# Patient Record
Sex: Female | Born: 1940 | Race: White | Hispanic: No | Marital: Married | Smoking: Former smoker
Health system: Southern US, Community
[De-identification: ages and names within clinical notes are randomized; demographics above are authoritative.]

## PROBLEM LIST (undated history)

## (undated) DIAGNOSIS — K219 Gastro-esophageal reflux disease without esophagitis: Secondary | ICD-10-CM

## (undated) DIAGNOSIS — F329 Major depressive disorder, single episode, unspecified: Secondary | ICD-10-CM

## (undated) DIAGNOSIS — I10 Essential (primary) hypertension: Secondary | ICD-10-CM

## (undated) DIAGNOSIS — T4145XA Adverse effect of unspecified anesthetic, initial encounter: Secondary | ICD-10-CM

## (undated) DIAGNOSIS — J449 Chronic obstructive pulmonary disease, unspecified: Secondary | ICD-10-CM

## (undated) DIAGNOSIS — T8859XA Other complications of anesthesia, initial encounter: Secondary | ICD-10-CM

## (undated) DIAGNOSIS — I2510 Atherosclerotic heart disease of native coronary artery without angina pectoris: Secondary | ICD-10-CM

## (undated) DIAGNOSIS — N189 Chronic kidney disease, unspecified: Secondary | ICD-10-CM

## (undated) DIAGNOSIS — M1990 Unspecified osteoarthritis, unspecified site: Secondary | ICD-10-CM

## (undated) DIAGNOSIS — F32A Depression, unspecified: Secondary | ICD-10-CM

## (undated) HISTORY — PX: ABDOMINAL HYSTERECTOMY: SHX81

---

## 2004-08-15 HISTORY — PX: CARDIAC CATHETERIZATION: SHX172

## 2005-02-09 ENCOUNTER — Other Ambulatory Visit: Payer: Self-pay

## 2005-02-09 ENCOUNTER — Inpatient Hospital Stay: Payer: Self-pay | Admitting: Internal Medicine

## 2005-06-27 ENCOUNTER — Ambulatory Visit: Payer: Self-pay | Admitting: Internal Medicine

## 2005-09-05 ENCOUNTER — Ambulatory Visit: Payer: Self-pay | Admitting: Pain Medicine

## 2005-09-27 ENCOUNTER — Ambulatory Visit: Payer: Self-pay | Admitting: Internal Medicine

## 2005-09-28 ENCOUNTER — Ambulatory Visit: Payer: Self-pay | Admitting: Pain Medicine

## 2006-10-14 ENCOUNTER — Inpatient Hospital Stay: Payer: Self-pay | Admitting: Internal Medicine

## 2006-10-14 ENCOUNTER — Other Ambulatory Visit: Payer: Self-pay

## 2006-11-16 ENCOUNTER — Ambulatory Visit: Payer: Self-pay | Admitting: Internal Medicine

## 2007-01-01 ENCOUNTER — Ambulatory Visit: Payer: Self-pay | Admitting: Unknown Physician Specialty

## 2007-01-02 ENCOUNTER — Ambulatory Visit: Payer: Self-pay | Admitting: Unknown Physician Specialty

## 2007-04-19 ENCOUNTER — Ambulatory Visit: Payer: Self-pay | Admitting: Unknown Physician Specialty

## 2007-09-24 ENCOUNTER — Ambulatory Visit: Payer: Self-pay | Admitting: Internal Medicine

## 2008-01-10 ENCOUNTER — Ambulatory Visit: Payer: Self-pay | Admitting: Internal Medicine

## 2008-08-15 HISTORY — PX: CATARACT EXTRACTION W/ INTRAOCULAR LENS IMPLANT: SHX1309

## 2008-09-09 ENCOUNTER — Ambulatory Visit: Payer: Self-pay | Admitting: Unknown Physician Specialty

## 2008-10-18 ENCOUNTER — Inpatient Hospital Stay: Payer: Self-pay | Admitting: Orthopedic Surgery

## 2008-12-03 ENCOUNTER — Ambulatory Visit: Payer: Self-pay | Admitting: Internal Medicine

## 2009-04-02 ENCOUNTER — Ambulatory Visit: Payer: Self-pay | Admitting: Orthopedic Surgery

## 2009-04-09 ENCOUNTER — Inpatient Hospital Stay: Payer: Self-pay | Admitting: Orthopedic Surgery

## 2009-04-13 ENCOUNTER — Encounter: Payer: Self-pay | Admitting: Internal Medicine

## 2009-04-15 ENCOUNTER — Encounter: Payer: Self-pay | Admitting: Internal Medicine

## 2009-07-07 ENCOUNTER — Ambulatory Visit: Payer: Self-pay | Admitting: Ophthalmology

## 2009-07-20 ENCOUNTER — Ambulatory Visit: Payer: Self-pay | Admitting: Ophthalmology

## 2009-08-15 HISTORY — PX: CATARACT EXTRACTION W/ INTRAOCULAR LENS IMPLANT: SHX1309

## 2009-11-16 ENCOUNTER — Ambulatory Visit: Payer: Self-pay | Admitting: Ophthalmology

## 2009-12-30 ENCOUNTER — Ambulatory Visit: Payer: Self-pay | Admitting: Internal Medicine

## 2010-05-03 ENCOUNTER — Ambulatory Visit: Payer: Self-pay | Admitting: Otolaryngology

## 2010-06-07 ENCOUNTER — Ambulatory Visit: Payer: Self-pay | Admitting: Orthopedic Surgery

## 2010-07-12 ENCOUNTER — Ambulatory Visit: Payer: Self-pay | Admitting: Anesthesiology

## 2010-08-15 HISTORY — PX: JOINT REPLACEMENT: SHX530

## 2010-09-02 ENCOUNTER — Ambulatory Visit: Payer: Self-pay | Admitting: Anesthesiology

## 2011-07-27 ENCOUNTER — Ambulatory Visit: Payer: Self-pay | Admitting: Internal Medicine

## 2011-08-16 HISTORY — PX: CAROTID ENDARTERECTOMY: SUR193

## 2012-02-24 ENCOUNTER — Ambulatory Visit: Payer: Self-pay | Admitting: Orthopedic Surgery

## 2012-09-18 ENCOUNTER — Ambulatory Visit: Payer: Self-pay | Admitting: Internal Medicine

## 2013-06-24 ENCOUNTER — Ambulatory Visit: Payer: Self-pay | Admitting: Vascular Surgery

## 2013-08-15 DIAGNOSIS — N189 Chronic kidney disease, unspecified: Secondary | ICD-10-CM

## 2013-08-15 HISTORY — DX: Chronic kidney disease, unspecified: N18.9

## 2013-08-22 ENCOUNTER — Ambulatory Visit: Payer: Self-pay | Admitting: Physical Medicine and Rehabilitation

## 2013-09-25 ENCOUNTER — Ambulatory Visit: Payer: Self-pay | Admitting: Internal Medicine

## 2013-10-24 ENCOUNTER — Ambulatory Visit: Payer: Self-pay | Admitting: Vascular Surgery

## 2013-10-24 LAB — BASIC METABOLIC PANEL
Anion Gap: 5 — ABNORMAL LOW (ref 7–16)
BUN: 23 mg/dL — AB (ref 7–18)
CO2: 26 mmol/L (ref 21–32)
Calcium, Total: 9.5 mg/dL (ref 8.5–10.1)
Chloride: 103 mmol/L (ref 98–107)
Creatinine: 1.23 mg/dL (ref 0.60–1.30)
EGFR (African American): 50 — ABNORMAL LOW
EGFR (Non-African Amer.): 43 — ABNORMAL LOW
Glucose: 75 mg/dL (ref 65–99)
OSMOLALITY: 271 (ref 275–301)
POTASSIUM: 4.5 mmol/L (ref 3.5–5.1)
Sodium: 134 mmol/L — ABNORMAL LOW (ref 136–145)

## 2013-10-24 LAB — CBC
HCT: 42.2 % (ref 35.0–47.0)
HGB: 14.4 g/dL (ref 12.0–16.0)
MCH: 30.5 pg (ref 26.0–34.0)
MCHC: 34 g/dL (ref 32.0–36.0)
MCV: 89 fL (ref 80–100)
PLATELETS: 240 10*3/uL (ref 150–440)
RBC: 4.72 10*6/uL (ref 3.80–5.20)
RDW: 14.5 % (ref 11.5–14.5)
WBC: 9.9 10*3/uL (ref 3.6–11.0)

## 2013-10-24 LAB — URINALYSIS, COMPLETE
Blood: NEGATIVE
GLUCOSE, UR: NEGATIVE mg/dL (ref 0–75)
Ketone: NEGATIVE
Leukocyte Esterase: NEGATIVE
Nitrite: NEGATIVE
Ph: 5 (ref 4.5–8.0)
Protein: 100
Specific Gravity: 1.03 (ref 1.003–1.030)
WBC UR: NONE SEEN /HPF (ref 0–5)

## 2013-10-30 ENCOUNTER — Inpatient Hospital Stay: Payer: Self-pay | Admitting: Vascular Surgery

## 2013-10-31 LAB — BASIC METABOLIC PANEL
Anion Gap: 3 — ABNORMAL LOW (ref 7–16)
BUN: 21 mg/dL — AB (ref 7–18)
CALCIUM: 8.5 mg/dL (ref 8.5–10.1)
CO2: 32 mmol/L (ref 21–32)
Chloride: 100 mmol/L (ref 98–107)
Creatinine: 1.19 mg/dL (ref 0.60–1.30)
GFR CALC AF AMER: 52 — AB
GFR CALC NON AF AMER: 45 — AB
Glucose: 108 mg/dL — ABNORMAL HIGH (ref 65–99)
Osmolality: 274 (ref 275–301)
POTASSIUM: 3.9 mmol/L (ref 3.5–5.1)
SODIUM: 135 mmol/L — AB (ref 136–145)

## 2013-10-31 LAB — CBC WITH DIFFERENTIAL/PLATELET
BASOS ABS: 0 10*3/uL (ref 0.0–0.1)
Basophil %: 0.5 %
EOS ABS: 0.1 10*3/uL (ref 0.0–0.7)
EOS PCT: 0.8 %
HCT: 38.3 % (ref 35.0–47.0)
HGB: 12.6 g/dL (ref 12.0–16.0)
LYMPHS ABS: 1.5 10*3/uL (ref 1.0–3.6)
Lymphocyte %: 17.4 %
MCH: 29.6 pg (ref 26.0–34.0)
MCHC: 33 g/dL (ref 32.0–36.0)
MCV: 90 fL (ref 80–100)
Monocyte #: 0.9 x10 3/mm (ref 0.2–0.9)
Monocyte %: 10 %
NEUTROS PCT: 71.3 %
Neutrophil #: 6.2 10*3/uL (ref 1.4–6.5)
Platelet: 192 10*3/uL (ref 150–440)
RBC: 4.27 10*6/uL (ref 3.80–5.20)
RDW: 14.7 % — ABNORMAL HIGH (ref 11.5–14.5)
WBC: 8.6 10*3/uL (ref 3.6–11.0)

## 2013-10-31 LAB — APTT: Activated PTT: 25.8 secs (ref 23.6–35.9)

## 2013-10-31 LAB — PATHOLOGY REPORT

## 2013-10-31 LAB — PROTIME-INR
INR: 1
Prothrombin Time: 13.2 secs (ref 11.5–14.7)

## 2014-01-16 ENCOUNTER — Ambulatory Visit: Payer: Self-pay | Admitting: Internal Medicine

## 2014-01-22 ENCOUNTER — Ambulatory Visit: Payer: Self-pay | Admitting: Internal Medicine

## 2014-10-27 ENCOUNTER — Ambulatory Visit: Payer: Self-pay | Admitting: Internal Medicine

## 2014-12-06 NOTE — Op Note (Signed)
PATIENT NAME:  Kathleen Cox, Kathleen Cox MR#:  [id number] DATE OF BIRTH:  01/28/1941  DATE OF PROCEDURE:  10/30/2013  PREOPERATIVE DIAGNOSES:  1.  High-grade right carotid artery stenosis.  2.  Chronic obstructive pulmonary disease.  3.  Cardiac arrhythmias.  4.  Hyperlipidemia.   POSTOPERATIVE DIAGNOSES:  1.  High-grade right carotid artery stenosis.  2.  Chronic obstructive pulmonary disease.  3.  Cardiac arrhythmias.  4.  Hyperlipidemia.   PROCEDURE: Right carotid endarterectomy with CorMatrix arterial patch reconstruction.   SURGEON: Algernon Huxley, M.D.   ANESTHESIA: General.   ESTIMATED BLOOD LOSS: Approximately 150 mL.   INDICATION FOR PROCEDURE: This is a 74 year old lady with high-grade right carotid artery stenosis. She was offered carotid endarterectomy for stroke risk reduction. Risks and benefits were discussed. Informed consent was obtained.   DESCRIPTION OF PROCEDURE: The patient is brought to the operative suite, and after an adequate level of general anesthesia was obtained, she was placed in modified beach chair position. A roll was placed under her shoulder and neck was flexed and turned the left. The neck was then sterilely prepped and draped and a sterile surgical field was created.   An incision was created along the anterior border of the sternocleidomastoid. We dissected down through the platysma with electrocautery and the Weitlaner retractor was used to facilitate our exposure. Several crossing vein branches were ligated and divided between silk ties, and the facial vein was ligated and divided between silk ties. This exposed the carotid bifurcation. She had some significant depth to her carotid in the neck, but once the 2 Weitlaner retractors were  placed, this facilitated our exposure and we were easily able to identify and dissect out the artery.   The common carotid artery, superior thyroid artery, external carotid artery, and the internal carotid artery were all  dissected out and encircled with vessel loops. The patient was systemically heparinized with 5000 units of intravenous heparin, and this was allowed to circulate for approximately 4 minutes. Control was then pulled up on the vessel loops. An anterior wall arteriotomy was created with an 11-blade and extended with Potts scissors. The Pruitt-Inahara shunt was then placed first in the internal carotid artery, flushed and de-aired, then in the common carotid artery, flushed and de-aired, and then flow was restored through the shunt.   Elapsed time from clamp to shunt placement was less than 2 minutes.   I then performed an endarterectomy in the standard fashion. The proximal endpoint was cut flush with tenotomy scissors. An eversion endarterectomy was performed on the external carotid artery and nice feathered distal endpoint was created with gentle traction on the internal carotid artery. All loose flecks were removed. The vessel was locally heparinized. The distal endpoint was tacked down with several 7-0 Prolene sutures. She had a reasonably small artery and a CorMatrix patch was used to reconstruct the artery. This was cut and beveled distally and a 6-0 Prolene was started at the distal endpoint and run approximately one half the length of the arteriotomy. The patch was then cut and beveled to an appropriate length proximally and a second 6-0 Prolene was started.   The medial suture lines were run together and tied. The lateral suture line was run approximately one quarter length of the arteriotomy. The Pruitt-Inahara shunt was then removed. The vessel was flushed from the internal, external, and common carotid arteries, and locally heparinized. The suture line was then completed, flushing through the external carotid artery, allowing several cardiac cycles to  traverse up the external carotid artery before releasing the internal carotid artery.   Approximately a 3-minute elapse from shunt removal to  restoration of flow through the internal carotid artery. The wound was then irrigated. A couple of 6-0 Prolene patch sutures were used for hemostasis. Surgicel was placed, then closed the wound with three 3-0 Vicryl sutures in the sternocleidomastoid space. The platysma was closed with a running 3-0 Vicryl and the skin was closed with 4-0 Monocryl. Dermabond was placed as a dressing. The patient was awakened from anesthesia, following commands, and taken to the recovery room in stable condition, having tolerated the procedure well.    ____________________________ Algernon Huxley, MD jsd:np D: 10/30/2013 12:41:47 ET T: 10/30/2013 21:07:56 ET JOB#: [id number]  cc: Algernon Huxley, MD, <Dictator> Ocie Cornfield. Ouida Sills, MD Algernon Huxley MD ELECTRONICALLY SIGNED 11/18/2013 9:44

## 2015-05-05 ENCOUNTER — Other Ambulatory Visit: Payer: Self-pay | Admitting: Physical Medicine and Rehabilitation

## 2015-05-05 DIAGNOSIS — M5412 Radiculopathy, cervical region: Secondary | ICD-10-CM

## 2015-05-13 ENCOUNTER — Ambulatory Visit
Admission: RE | Admit: 2015-05-13 | Discharge: 2015-05-13 | Disposition: A | Discharge status: Home / Self Care | Payer: Medicare Other | Source: Ambulatory Visit | Attending: Physical Medicine and Rehabilitation | Admitting: Physical Medicine and Rehabilitation

## 2015-05-13 DIAGNOSIS — M4802 Spinal stenosis, cervical region: Secondary | ICD-10-CM | POA: Insufficient documentation

## 2015-05-13 DIAGNOSIS — M4692 Unspecified inflammatory spondylopathy, cervical region: Secondary | ICD-10-CM | POA: Diagnosis not present

## 2015-05-13 DIAGNOSIS — M5412 Radiculopathy, cervical region: Secondary | ICD-10-CM | POA: Diagnosis not present

## 2015-05-13 DIAGNOSIS — M5033 Other cervical disc degeneration, cervicothoracic region: Secondary | ICD-10-CM | POA: Diagnosis not present

## 2015-06-29 ENCOUNTER — Other Ambulatory Visit: Payer: Self-pay | Admitting: Internal Medicine

## 2015-06-29 DIAGNOSIS — Z1231 Encounter for screening mammogram for malignant neoplasm of breast: Secondary | ICD-10-CM

## 2015-07-14 ENCOUNTER — Ambulatory Visit: Payer: Medicare Other

## 2015-07-14 ENCOUNTER — Encounter
Admission: RE | Admit: 2015-07-14 | Discharge: 2015-07-14 | Disposition: A | Discharge status: Home / Self Care | Payer: Medicare Other | Source: Ambulatory Visit | Attending: Orthopedic Surgery | Admitting: Orthopedic Surgery

## 2015-07-14 DIAGNOSIS — Z01812 Encounter for preprocedural laboratory examination: Secondary | ICD-10-CM | POA: Insufficient documentation

## 2015-07-14 DIAGNOSIS — I10 Essential (primary) hypertension: Secondary | ICD-10-CM

## 2015-07-14 DIAGNOSIS — Z01818 Encounter for other preprocedural examination: Secondary | ICD-10-CM | POA: Insufficient documentation

## 2015-07-14 HISTORY — DX: Adverse effect of unspecified anesthetic, initial encounter: T41.45XA

## 2015-07-14 HISTORY — DX: Chronic kidney disease, unspecified: N18.9

## 2015-07-14 HISTORY — DX: Unspecified osteoarthritis, unspecified site: M19.90

## 2015-07-14 HISTORY — DX: Chronic obstructive pulmonary disease, unspecified: J44.9

## 2015-07-14 HISTORY — DX: Other complications of anesthesia, initial encounter: T88.59XA

## 2015-07-14 HISTORY — DX: Atherosclerotic heart disease of native coronary artery without angina pectoris: I25.10

## 2015-07-14 HISTORY — DX: Gastro-esophageal reflux disease without esophagitis: K21.9

## 2015-07-14 HISTORY — DX: Depression, unspecified: F32.A

## 2015-07-14 HISTORY — DX: Essential (primary) hypertension: I10

## 2015-07-14 HISTORY — DX: Major depressive disorder, single episode, unspecified: F32.9

## 2015-07-14 LAB — CBC
HEMATOCRIT: 37.1 % (ref 35.0–47.0)
HEMOGLOBIN: 12.4 g/dL (ref 12.0–16.0)
MCH: 30.6 pg (ref 26.0–34.0)
MCHC: 33.3 g/dL (ref 32.0–36.0)
MCV: 91.8 fL (ref 80.0–100.0)
PLATELETS: 191 10*3/uL (ref 150–440)
RBC: 4.04 MIL/uL (ref 3.80–5.20)
RDW: 15.2 % — AB (ref 11.5–14.5)
WBC: 8.3 10*3/uL (ref 3.6–11.0)

## 2015-07-14 LAB — BASIC METABOLIC PANEL
Anion gap: 9 (ref 5–15)
BUN: 26 mg/dL — AB (ref 6–20)
CALCIUM: 9.4 mg/dL (ref 8.9–10.3)
CO2: 25 mmol/L (ref 22–32)
CREATININE: 1.1 mg/dL — AB (ref 0.44–1.00)
Chloride: 105 mmol/L (ref 101–111)
GFR calc Af Amer: 55 mL/min — ABNORMAL LOW (ref >60)
GFR, EST NON AFRICAN AMERICAN: 48 mL/min — AB (ref >60)
GLUCOSE: 92 mg/dL (ref 65–99)
Potassium: 4.8 mmol/L (ref 3.5–5.1)
SODIUM: 139 mmol/L (ref 135–145)

## 2015-07-14 LAB — URINALYSIS COMPLETE WITH MICROSCOPIC (ARMC ONLY)
Bilirubin Urine: NEGATIVE
GLUCOSE, UA: NEGATIVE mg/dL
HGB URINE DIPSTICK: NEGATIVE
Leukocytes, UA: NEGATIVE
Nitrite: NEGATIVE
PH: 5 (ref 5.0–8.0)
PROTEIN: 30 mg/dL — AB
SPECIFIC GRAVITY, URINE: 1.023 (ref 1.005–1.030)

## 2015-07-14 LAB — PROTIME-INR
INR: 1.02
Prothrombin Time: 13.6 seconds (ref 11.4–15.0)

## 2015-07-14 LAB — SURGICAL PCR SCREEN
MRSA, PCR: NEGATIVE
STAPHYLOCOCCUS AUREUS: NEGATIVE

## 2015-07-14 LAB — TYPE AND SCREEN
ABO/RH(D): O POS
ANTIBODY SCREEN: NEGATIVE

## 2015-07-14 LAB — SEDIMENTATION RATE: SED RATE: 69 mm/h — AB (ref 0–30)

## 2015-07-14 LAB — ABO/RH: ABO/RH(D): O POS

## 2015-07-14 LAB — APTT: APTT: 26 s (ref 24–36)

## 2015-07-14 NOTE — Pre-Procedure Instructions (Signed)
Spoke with Kathleen Cox at Dr. Alveria Apley office regarding cardiac clearance request by Dr. Ronelle Nigh for changes EKG changes.  Dr. Nehemiah Massed saw pt in June of 2016.

## 2015-07-14 NOTE — Pre-Procedure Instructions (Signed)
EKG reviewed with Dr. Ronelle Nigh, cardiac clearance requested for questionable Anterior MI and changes in EKG faxed to Dr's Rudene Christians and [employer].  Spoke with Mendel Ryder Dr. Theodore Demark nurse regarding cardiac clearance request.

## 2015-07-14 NOTE — Pre-Procedure Instructions (Signed)
Spoke with Mendel Ryder at Dr. Rudene Christians office regarding stopping of aspirin.  Mendel Ryder will check with pt's cardiologist and call pt with instructions.

## 2015-07-14 NOTE — Patient Instructions (Signed)
  Your procedure is scheduled on: Thursday Dec. 8, 2016. Report to Same Day Surgery. To find out your arrival time please call [phone] between 1PM - 3PM on Wednesday Dec. 7, 2016.  Remember: Instructions that are not followed completely may result in serious medical risk, up to and including death, or upon the discretion of your surgeon and anesthesiologist your surgery may need to be rescheduled.    _x___ 1. Do not eat food or drink liquids after midnight. No gum chewing or hard candies.     ____ 2. No Alcohol for 24 hours before or after surgery.   ____ 3. Bring all medications with you on the day of surgery if instructed.    __x__ 4. Notify your doctor if there is any change in your medical condition     (cold, fever, infections).     Do not wear jewelry, make-up, hairpins, clips or nail polish.  Do not wear lotions, powders, or perfumes. You may wear deodorant.  Do not shave 48 hours prior to surgery. Men may shave face and neck.  Do not bring valuables to the hospital.    [facility] is not responsible for any belongings or valuables.               Contacts, dentures or bridgework may not be worn into surgery.  Leave your suitcase in the car. After surgery it may be brought to your room.  For patients admitted to the hospital, discharge time is determined by your treatment team.   Patients discharged the day of surgery will not be allowed to drive home.    Please read over the following fact sheets that you were given:   [facility] Preparing for Surgery  _x___ Take these medicines the morning of surgery with A SIP OF WATER:    1. diltiazem (DILACOR XR)  2. losartan (COZAAR)  3. omeprazole (PRILOSEC OTC)  4.tiotropium (SPIRIVA)    ____ Fleet Enema (as directed)   _x___ Use CHG Soap as directed  ____ Use inhalers on the day of surgery  ____ Stop metformin 2 days prior to surgery    ____ Take 1/2 of usual insulin dose the night before surgery and none on the  morning of surgery.   _x___ Check with Dr. Rudene Christians if and when to stop aspirin.  ____ Stop Anti-inflammatories on does not apply due to stage 3 kidney disease.   ____ Stop supplements until after surgery.    ____ Bring C-Pap to the hospital.

## 2015-07-20 NOTE — OR Nursing (Signed)
Cleared by Dr Nehemiah Massed 07/16/15

## 2015-07-23 ENCOUNTER — Inpatient Hospital Stay: Payer: Medicare Other | Admitting: Certified Registered"

## 2015-07-23 ENCOUNTER — Inpatient Hospital Stay: Payer: Medicare Other

## 2015-07-23 ENCOUNTER — Encounter: Payer: Self-pay | Admitting: *Deleted

## 2015-07-23 ENCOUNTER — Inpatient Hospital Stay
Admission: RE | Admit: 2015-07-23 | Discharge: 2015-07-28 | DRG: 469 | Disposition: A | Discharge status: Home Health | Payer: Medicare Other | Source: Ambulatory Visit | Attending: Orthopedic Surgery | Admitting: Orthopedic Surgery

## 2015-07-23 ENCOUNTER — Encounter
Admission: RE | Disposition: A | Discharge status: Home Health | Payer: Self-pay | Source: Ambulatory Visit | Attending: Orthopedic Surgery

## 2015-07-23 DIAGNOSIS — E875 Hyperkalemia: Secondary | ICD-10-CM | POA: Diagnosis not present

## 2015-07-23 DIAGNOSIS — E871 Hypo-osmolality and hyponatremia: Secondary | ICD-10-CM | POA: Diagnosis not present

## 2015-07-23 DIAGNOSIS — M1611 Unilateral primary osteoarthritis, right hip: Secondary | ICD-10-CM | POA: Diagnosis present

## 2015-07-23 DIAGNOSIS — J440 Chronic obstructive pulmonary disease with acute lower respiratory infection: Secondary | ICD-10-CM | POA: Diagnosis present

## 2015-07-23 DIAGNOSIS — Z7982 Long term (current) use of aspirin: Secondary | ICD-10-CM | POA: Diagnosis not present

## 2015-07-23 DIAGNOSIS — N183 Chronic kidney disease, stage 3 (moderate): Secondary | ICD-10-CM | POA: Diagnosis present

## 2015-07-23 DIAGNOSIS — Z6826 Body mass index (BMI) 26.0-26.9, adult: Secondary | ICD-10-CM | POA: Diagnosis not present

## 2015-07-23 DIAGNOSIS — K219 Gastro-esophageal reflux disease without esophagitis: Secondary | ICD-10-CM | POA: Diagnosis present

## 2015-07-23 DIAGNOSIS — J449 Chronic obstructive pulmonary disease, unspecified: Secondary | ICD-10-CM | POA: Diagnosis present

## 2015-07-23 DIAGNOSIS — Z419 Encounter for procedure for purposes other than remedying health state, unspecified: Secondary | ICD-10-CM

## 2015-07-23 DIAGNOSIS — J189 Pneumonia, unspecified organism: Secondary | ICD-10-CM | POA: Diagnosis present

## 2015-07-23 DIAGNOSIS — D638 Anemia in other chronic diseases classified elsewhere: Secondary | ICD-10-CM | POA: Diagnosis present

## 2015-07-23 DIAGNOSIS — F17210 Nicotine dependence, cigarettes, uncomplicated: Secondary | ICD-10-CM | POA: Diagnosis present

## 2015-07-23 DIAGNOSIS — D62 Acute posthemorrhagic anemia: Secondary | ICD-10-CM | POA: Diagnosis not present

## 2015-07-23 DIAGNOSIS — E669 Obesity, unspecified: Secondary | ICD-10-CM | POA: Diagnosis present

## 2015-07-23 DIAGNOSIS — I129 Hypertensive chronic kidney disease with stage 1 through stage 4 chronic kidney disease, or unspecified chronic kidney disease: Secondary | ICD-10-CM | POA: Diagnosis present

## 2015-07-23 DIAGNOSIS — Z79899 Other long term (current) drug therapy: Secondary | ICD-10-CM

## 2015-07-23 DIAGNOSIS — J9691 Respiratory failure, unspecified with hypoxia: Secondary | ICD-10-CM | POA: Diagnosis not present

## 2015-07-23 DIAGNOSIS — Z888 Allergy status to other drugs, medicaments and biological substances status: Secondary | ICD-10-CM

## 2015-07-23 DIAGNOSIS — I2510 Atherosclerotic heart disease of native coronary artery without angina pectoris: Secondary | ICD-10-CM | POA: Diagnosis present

## 2015-07-23 DIAGNOSIS — R0902 Hypoxemia: Secondary | ICD-10-CM

## 2015-07-23 DIAGNOSIS — Z881 Allergy status to other antibiotic agents status: Secondary | ICD-10-CM

## 2015-07-23 DIAGNOSIS — G8918 Other acute postprocedural pain: Secondary | ICD-10-CM

## 2015-07-23 HISTORY — PX: TOTAL HIP ARTHROPLASTY: SHX124

## 2015-07-23 LAB — CBC
HCT: 32 % — ABNORMAL LOW (ref 35.0–47.0)
HEMOGLOBIN: 10.4 g/dL — AB (ref 12.0–16.0)
MCH: 30.4 pg (ref 26.0–34.0)
MCHC: 32.5 g/dL (ref 32.0–36.0)
MCV: 93.4 fL (ref 80.0–100.0)
Platelets: 245 10*3/uL (ref 150–440)
RBC: 3.43 MIL/uL — AB (ref 3.80–5.20)
RDW: 15.5 % — ABNORMAL HIGH (ref 11.5–14.5)
WBC: 5.1 10*3/uL (ref 3.6–11.0)

## 2015-07-23 LAB — CREATININE, SERUM
Creatinine, Ser: 1.69 mg/dL — ABNORMAL HIGH (ref 0.44–1.00)
GFR calc Af Amer: 33 mL/min — ABNORMAL LOW (ref >60)
GFR calc non Af Amer: 28 mL/min — ABNORMAL LOW (ref >60)

## 2015-07-23 SURGERY — ARTHROPLASTY, HIP, TOTAL, ANTERIOR APPROACH
Anesthesia: Spinal | Laterality: Right

## 2015-07-23 MED ORDER — PHENYLEPHRINE HCL 10 MG/ML IJ SOLN
INTRAMUSCULAR | Status: DC | PRN
  Administered 2015-07-23 (×4): 50 ug via INTRAVENOUS

## 2015-07-23 MED ORDER — ALPRAZOLAM 0.5 MG PO TABS
0.5000 mg | ORAL_TABLET | Freq: Every day | ORAL | Status: DC | PRN
  Administered 2015-07-25 – 2015-07-26 (×2): 0.5 mg via ORAL
  Filled 2015-07-23 (×2): qty 1

## 2015-07-23 MED ORDER — CLINDAMYCIN PHOSPHATE 900 MG/50ML IV SOLN
900.0000 mg | Freq: Once | INTRAVENOUS | Status: AC
  Administered 2015-07-23: 900 mg via INTRAVENOUS

## 2015-07-23 MED ORDER — MENTHOL 3 MG MT LOZG
1.0000 | LOZENGE | OROMUCOSAL | Status: DC | PRN
  Filled 2015-07-23 (×3): qty 9

## 2015-07-23 MED ORDER — LIDOCAINE HCL (PF) 2 % IJ SOLN
INTRAMUSCULAR | Status: DC | PRN
  Administered 2015-07-23: 50 mg

## 2015-07-23 MED ORDER — CLINDAMYCIN PHOSPHATE 600 MG/50ML IV SOLN
600.0000 mg | Freq: Four times a day (QID) | INTRAVENOUS | Status: AC
  Administered 2015-07-23 (×3): 600 mg via INTRAVENOUS
  Filled 2015-07-23 (×3): qty 50

## 2015-07-23 MED ORDER — SODIUM CHLORIDE 0.9 % IV SOLN
INTRAVENOUS | Status: DC
  Administered 2015-07-23 (×2): via INTRAVENOUS

## 2015-07-23 MED ORDER — ZOLPIDEM TARTRATE 5 MG PO TABS
5.0000 mg | ORAL_TABLET | Freq: Every evening | ORAL | Status: DC | PRN
  Administered 2015-07-26 – 2015-07-27 (×2): 5 mg via ORAL
  Filled 2015-07-23 (×2): qty 1

## 2015-07-23 MED ORDER — PHENOL 1.4 % MT LIQD
1.0000 | OROMUCOSAL | Status: DC | PRN
  Filled 2015-07-23: qty 177

## 2015-07-23 MED ORDER — DOCUSATE SODIUM 100 MG PO CAPS
100.0000 mg | ORAL_CAPSULE | Freq: Two times a day (BID) | ORAL | Status: DC
  Administered 2015-07-23 – 2015-07-28 (×11): 100 mg via ORAL
  Filled 2015-07-23 (×11): qty 1

## 2015-07-23 MED ORDER — NEOMYCIN-POLYMYXIN B GU 40-200000 IR SOLN
Status: AC
  Filled 2015-07-23: qty 4

## 2015-07-23 MED ORDER — FENTANYL CITRATE (PF) 100 MCG/2ML IJ SOLN
INTRAMUSCULAR | Status: DC | PRN
Start: 2015-07-23 — End: 2015-07-23
  Administered 2015-07-23: 50 ug via INTRAVENOUS

## 2015-07-23 MED ORDER — LOSARTAN POTASSIUM 50 MG PO TABS
100.0000 mg | ORAL_TABLET | Freq: Every day | ORAL | Status: DC
  Administered 2015-07-24: 100 mg via ORAL
  Filled 2015-07-23: qty 2

## 2015-07-23 MED ORDER — ENOXAPARIN SODIUM 30 MG/0.3ML ~~LOC~~ SOLN
30.0000 mg | SUBCUTANEOUS | Status: DC
  Administered 2015-07-24 – 2015-07-25 (×2): 30 mg via SUBCUTANEOUS
  Filled 2015-07-23 (×2): qty 0.3

## 2015-07-23 MED ORDER — CALCIUM CITRATE-VITAMIN D 500-400 MG-UNIT PO CHEW
1.0000 | CHEWABLE_TABLET | Freq: Every day | ORAL | Status: DC
  Administered 2015-07-24 – 2015-07-28 (×4): 1 via ORAL
  Filled 2015-07-23 (×6): qty 1

## 2015-07-23 MED ORDER — ASPIRIN EC 81 MG PO TBEC
81.0000 mg | DELAYED_RELEASE_TABLET | Freq: Every day | ORAL | Status: DC
  Administered 2015-07-23 – 2015-07-25 (×3): 81 mg via ORAL
  Filled 2015-07-23 (×3): qty 1

## 2015-07-23 MED ORDER — DILTIAZEM HCL ER COATED BEADS 240 MG PO CP24
240.0000 mg | ORAL_CAPSULE | Freq: Every day | ORAL | Status: DC
  Administered 2015-07-24 – 2015-07-28 (×5): 240 mg via ORAL
  Filled 2015-07-23 (×6): qty 1

## 2015-07-23 MED ORDER — SIMVASTATIN 20 MG PO TABS
10.0000 mg | ORAL_TABLET | Freq: Every day | ORAL | Status: DC
  Administered 2015-07-23 – 2015-07-27 (×5): 10 mg via ORAL
  Filled 2015-07-23 (×5): qty 1

## 2015-07-23 MED ORDER — PANTOPRAZOLE SODIUM 40 MG PO TBEC
40.0000 mg | DELAYED_RELEASE_TABLET | Freq: Every day | ORAL | Status: DC
  Administered 2015-07-24 – 2015-07-28 (×5): 40 mg via ORAL
  Filled 2015-07-23 (×5): qty 1

## 2015-07-23 MED ORDER — MORPHINE SULFATE (PF) 2 MG/ML IV SOLN
2.0000 mg | INTRAVENOUS | Status: DC | PRN
  Administered 2015-07-23 – 2015-07-24 (×5): 2 mg via INTRAVENOUS
  Filled 2015-07-23 (×5): qty 1

## 2015-07-23 MED ORDER — ACETAMINOPHEN 650 MG RE SUPP: 650.0000 mg | Freq: Four times a day (QID) | RECTAL | Status: DC | PRN

## 2015-07-23 MED ORDER — ONDANSETRON HCL 4 MG/2ML IJ SOLN
4.0000 mg | Freq: Four times a day (QID) | INTRAMUSCULAR | Status: DC | PRN
Start: 2015-07-23 — End: 2015-07-28
  Administered 2015-07-23: 4 mg via INTRAVENOUS
  Filled 2015-07-23: qty 2

## 2015-07-23 MED ORDER — ALPRAZOLAM ER 0.5 MG PO TB24: 0.5000 mg | ORAL_TABLET | Freq: Every day | ORAL | Status: DC | PRN

## 2015-07-23 MED ORDER — OMEPRAZOLE MAGNESIUM 20 MG PO TBEC: 20.0000 mg | DELAYED_RELEASE_TABLET | Freq: Every day | ORAL | Status: DC

## 2015-07-23 MED ORDER — NEOMYCIN-POLYMYXIN B GU 40-200000 IR SOLN
Status: DC | PRN
  Administered 2015-07-23: 4 mL

## 2015-07-23 MED ORDER — HYDROCODONE-ACETAMINOPHEN 10-325 MG PO TABS
1.0000 | ORAL_TABLET | ORAL | Status: DC | PRN
  Administered 2015-07-23 (×2): 1 via ORAL
  Administered 2015-07-23: 2 via ORAL
  Administered 2015-07-24 (×4): 1 via ORAL
  Administered 2015-07-25: 2 via ORAL
  Administered 2015-07-25 – 2015-07-28 (×10): 1 via ORAL
  Filled 2015-07-23 (×6): qty 1
  Filled 2015-07-23: qty 2
  Filled 2015-07-23: qty 1
  Filled 2015-07-23: qty 2
  Filled 2015-07-23 (×2): qty 1
  Filled 2015-07-23: qty 2
  Filled 2015-07-23 (×7): qty 1

## 2015-07-23 MED ORDER — CLINDAMYCIN PHOSPHATE 900 MG/50ML IV SOLN
INTRAVENOUS | Status: AC
  Filled 2015-07-23: qty 50

## 2015-07-23 MED ORDER — ENOXAPARIN SODIUM 40 MG/0.4ML ~~LOC~~ SOLN: 40.0000 mg | SUBCUTANEOUS | Status: DC

## 2015-07-23 MED ORDER — VITAMIN C 500 MG PO TABS
500.0000 mg | ORAL_TABLET | Freq: Every day | ORAL | Status: DC
  Administered 2015-07-24 – 2015-07-28 (×5): 500 mg via ORAL
  Filled 2015-07-23 (×5): qty 1

## 2015-07-23 MED ORDER — DIPHENHYDRAMINE HCL 12.5 MG/5ML PO ELIX: 12.5000 mg | ORAL_SOLUTION | ORAL | Status: DC | PRN

## 2015-07-23 MED ORDER — EPHEDRINE SULFATE 50 MG/ML IJ SOLN
INTRAMUSCULAR | Status: DC | PRN
  Administered 2015-07-23: 10 mg via INTRAVENOUS
  Administered 2015-07-23: 5 mg via INTRAVENOUS
  Administered 2015-07-23: 10 mg via INTRAVENOUS
  Administered 2015-07-23 (×2): 5 mg via INTRAVENOUS

## 2015-07-23 MED ORDER — MAGNESIUM CITRATE PO SOLN
1.0000 | Freq: Once | ORAL | Status: DC | PRN
  Filled 2015-07-23: qty 296

## 2015-07-23 MED ORDER — METOCLOPRAMIDE HCL 5 MG PO TABS: 5.0000 mg | ORAL_TABLET | Freq: Three times a day (TID) | ORAL | Status: DC | PRN

## 2015-07-23 MED ORDER — METHOCARBAMOL 500 MG PO TABS
500.0000 mg | ORAL_TABLET | Freq: Four times a day (QID) | ORAL | Status: DC | PRN
  Administered 2015-07-24 (×2): 500 mg via ORAL
  Filled 2015-07-23 (×2): qty 1

## 2015-07-23 MED ORDER — GLYCOPYRROLATE 0.2 MG/ML IJ SOLN
INTRAMUSCULAR | Status: DC | PRN
Start: 2015-07-23 — End: 2015-07-23
  Administered 2015-07-23: 0.2 mg via INTRAVENOUS

## 2015-07-23 MED ORDER — BUPIVACAINE HCL (PF) 0.5 % IJ SOLN
INTRAMUSCULAR | Status: DC | PRN
  Administered 2015-07-23: 3 mL via INTRATHECAL

## 2015-07-23 MED ORDER — FENTANYL CITRATE (PF) 100 MCG/2ML IJ SOLN: 25.0000 ug | INTRAMUSCULAR | Status: DC | PRN

## 2015-07-23 MED ORDER — LACTATED RINGERS IV SOLN
INTRAVENOUS | Status: DC
  Administered 2015-07-23 (×2): via INTRAVENOUS

## 2015-07-23 MED ORDER — ACETAMINOPHEN 10 MG/ML IV SOLN
INTRAVENOUS | Status: AC
  Filled 2015-07-23: qty 100

## 2015-07-23 MED ORDER — MAGNESIUM HYDROXIDE 400 MG/5ML PO SUSP
30.0000 mL | Freq: Every day | ORAL | Status: DC | PRN
  Administered 2015-07-24: 30 mL via ORAL
  Filled 2015-07-23 (×2): qty 30

## 2015-07-23 MED ORDER — ONDANSETRON HCL 4 MG/2ML IJ SOLN: 4.0000 mg | Freq: Once | INTRAMUSCULAR | Status: DC | PRN

## 2015-07-23 MED ORDER — METOCLOPRAMIDE HCL 5 MG/ML IJ SOLN: 5.0000 mg | Freq: Three times a day (TID) | INTRAMUSCULAR | Status: DC | PRN

## 2015-07-23 MED ORDER — TIOTROPIUM BROMIDE MONOHYDRATE 18 MCG IN CAPS
18.0000 ug | ORAL_CAPSULE | Freq: Every day | RESPIRATORY_TRACT | Status: DC | PRN
  Administered 2015-07-24: 18 ug via RESPIRATORY_TRACT
  Filled 2015-07-23: qty 5

## 2015-07-23 MED ORDER — ONDANSETRON HCL 4 MG PO TABS: 4.0000 mg | ORAL_TABLET | Freq: Four times a day (QID) | ORAL | Status: DC | PRN

## 2015-07-23 MED ORDER — BISACODYL 10 MG RE SUPP
10.0000 mg | Freq: Every day | RECTAL | Status: DC | PRN
Start: 2015-07-23 — End: 2015-07-28

## 2015-07-23 MED ORDER — MIDAZOLAM HCL 5 MG/5ML IJ SOLN
INTRAMUSCULAR | Status: DC | PRN
  Administered 2015-07-23 (×2): 1 mg via INTRAVENOUS

## 2015-07-23 MED ORDER — BUPIVACAINE-EPINEPHRINE (PF) 0.25% -1:200000 IJ SOLN
INTRAMUSCULAR | Status: AC
  Filled 2015-07-23: qty 30

## 2015-07-23 MED ORDER — PROPOFOL 500 MG/50ML IV EMUL
INTRAVENOUS | Status: DC | PRN
  Administered 2015-07-23: 35 ug/kg/min via INTRAVENOUS

## 2015-07-23 MED ORDER — METHOCARBAMOL 1000 MG/10ML IJ SOLN: 500.0000 mg | Freq: Four times a day (QID) | INTRAVENOUS | Status: DC | PRN

## 2015-07-23 MED ORDER — NICOTINE 14 MG/24HR TD PT24
14.0000 mg | MEDICATED_PATCH | Freq: Every day | TRANSDERMAL | Status: DC
  Administered 2015-07-23 – 2015-07-28 (×6): 14 mg via TRANSDERMAL
  Filled 2015-07-23 (×6): qty 1

## 2015-07-23 MED ORDER — ACETAMINOPHEN 325 MG PO TABS: 650.0000 mg | ORAL_TABLET | Freq: Four times a day (QID) | ORAL | Status: DC | PRN

## 2015-07-23 SURGICAL SUPPLY — 51 items
BLADE SAW 1/2 (BLADE) ×3 IMPLANT
BNDG COHESIVE 6X5 TAN STRL LF (GAUZE/BANDAGES/DRESSINGS) ×6 IMPLANT
CANISTER SUCT 1200ML W/VALVE (MISCELLANEOUS) ×6 IMPLANT
CAPT HIP TOTAL 3 ×3 IMPLANT
CATH FOL LEG HOLDER (MISCELLANEOUS) ×3 IMPLANT
CATH TRAY METER 16FR LF (MISCELLANEOUS) ×3 IMPLANT
CHLORAPREP W/TINT 26ML (MISCELLANEOUS) ×3 IMPLANT
DRAPE C-ARM XRAY 36X54 (DRAPES) ×3 IMPLANT
DRAPE C-SECTION (MISCELLANEOUS) IMPLANT
DRAPE INCISE IOBAN 66X60 STRL (DRAPES) IMPLANT
DRAPE POUCH INSTRU U-SHP 10X18 (DRAPES) ×3 IMPLANT
DRAPE SHEET LG 3/4 BI-LAMINATE (DRAPES) ×9 IMPLANT
DRAPE STERI IOBAN 125X83 (DRAPES) IMPLANT
DRAPE TABLE BACK 80X90 (DRAPES) ×3 IMPLANT
DRSG OPSITE POSTOP 4X8 (GAUZE/BANDAGES/DRESSINGS) IMPLANT
ELECT BLADE 6.5 EXT (BLADE) IMPLANT
GAUZE SPONGE 4X4 12PLY STRL (GAUZE/BANDAGES/DRESSINGS) IMPLANT
GLOVE BIO SURGEON STRL SZ7 (GLOVE) ×3 IMPLANT
GLOVE BIOGEL PI IND STRL 9 (GLOVE) ×1 IMPLANT
GLOVE BIOGEL PI INDICATOR 9 (GLOVE) ×2
GLOVE INDICATOR 7.5 STRL GRN (GLOVE) ×3 IMPLANT
GLOVE SURG ORTHO 9.0 STRL STRW (GLOVE) ×3 IMPLANT
GOWN SPECIALTY ULTRA XL (MISCELLANEOUS) ×3 IMPLANT
GOWN STRL REUS W/ TWL LRG LVL3 (GOWN DISPOSABLE) ×1 IMPLANT
GOWN STRL REUS W/TWL LRG LVL3 (GOWN DISPOSABLE) ×2
HEMOVAC 400CC 10FR (MISCELLANEOUS) IMPLANT
HOOD PEEL AWAY FACE SHEILD DIS (HOOD) ×3 IMPLANT
MAT BLUE FLOOR 46X72 FLO (MISCELLANEOUS) ×3 IMPLANT
NDL SAFETY 18GX1.5 (NEEDLE) ×3 IMPLANT
NEEDLE FILTER BLUNT 18X 1/2SAF (NEEDLE) ×2
NEEDLE FILTER BLUNT 18X1 1/2 (NEEDLE) ×1 IMPLANT
NEEDLE SPNL 18GX3.5 QUINCKE PK (NEEDLE) ×3 IMPLANT
NS IRRIG 1000ML POUR BTL (IV SOLUTION) ×3 IMPLANT
PACK HIP COMPR (MISCELLANEOUS) ×3 IMPLANT
SOL PREP PVP 2OZ (MISCELLANEOUS) ×3
SOLUTION PREP PVP 2OZ (MISCELLANEOUS) ×1 IMPLANT
STAPLER SKIN PROX 35W (STAPLE) ×3 IMPLANT
STRAP SAFETY BODY (MISCELLANEOUS) ×3 IMPLANT
SUT DVC 2 QUILL PDO  T11 36X36 (SUTURE) ×2
SUT DVC 2 QUILL PDO T11 36X36 (SUTURE) ×1 IMPLANT
SUT DVC QUILL MONODERM 30X30 (SUTURE) ×3 IMPLANT
SUT ETHIBOND NAB CT1 #1 30IN (SUTURE) IMPLANT
SUT SILK 0 (SUTURE) ×2
SUT SILK 0 30XBRD TIE 6 (SUTURE) ×1 IMPLANT
SUT VIC AB 1 CT1 36 (SUTURE) ×3 IMPLANT
SYR 20CC LL (SYRINGE) IMPLANT
SYR 30ML LL (SYRINGE) ×3 IMPLANT
SYRINGE 10CC LL (SYRINGE) ×3 IMPLANT
TAPE MICROFOAM 4IN (TAPE) IMPLANT
TUBE KAMVAC SUCTION (TUBING) ×3 IMPLANT
WATER STERILE IRR 1000ML POUR (IV SOLUTION) ×3 IMPLANT

## 2015-07-23 NOTE — Anesthesia Preprocedure Evaluation (Signed)
Anesthesia Evaluation  Patient identified by MRN, date of birth, ID band Patient awake    Reviewed: Allergy & Precautions, H&P , NPO status , Patient's Chart, lab work & pertinent test results, reviewed documented beta blocker date and time   History of Anesthesia Complications (+) AWARENESS UNDER ANESTHESIA and history of anesthetic complications  Airway Mallampati: II  TM Distance: >3 FB Neck ROM: full    Dental no notable dental hx.    Pulmonary neg pulmonary ROS, COPD, Current Smoker,    Pulmonary exam normal breath sounds clear to auscultation       Cardiovascular Exercise Tolerance: Good hypertension, + CAD  negative cardio ROS   Rhythm:regular Rate:Normal  Nehemiah Massed clearance    Neuro/Psych PSYCHIATRIC DISORDERS negative neurological ROS  negative psych ROS   GI/Hepatic negative GI ROS, Neg liver ROS, GERD  ,  Endo/Other  negative endocrine ROS  Renal/GU Renal diseasenegative Renal ROS  negative genitourinary   Musculoskeletal   Abdominal   Peds  Hematology negative hematology ROS (+)   Anesthesia Other Findings   Reproductive/Obstetrics negative OB ROS                             Anesthesia Physical Anesthesia Plan  ASA: III  Anesthesia Plan: General and Spinal   Post-op Pain Management:    Induction:   Airway Management Planned:   Additional Equipment:   Intra-op Plan:   Post-operative Plan:   Informed Consent: I have reviewed the patients History and Physical, chart, labs and discussed the procedure including the risks, benefits and alternatives for the proposed anesthesia with the patient or authorized representative who has indicated his/her understanding and acceptance.   Dental Advisory Given  Plan Discussed with: CRNA  Anesthesia Plan Comments:         Anesthesia Quick Evaluation

## 2015-07-23 NOTE — Transfer of Care (Signed)
Immediate Anesthesia Transfer of Care Note  Patient: Kathleen Cox  Procedure(s) Performed: Procedure(s): TOTAL HIP ARTHROPLASTY ANTERIOR APPROACH (Right)  Patient Location: PACU  Anesthesia Type:Spinal  Level of Consciousness: awake  Airway & Oxygen Therapy: Patient Spontanous Breathing and Patient connected to face mask oxygen  Post-op Assessment: Report given to RN  Post vital signs: Reviewed  Last Vitals:  Filed Vitals:   07/23/15 0628 07/23/15 0905  BP: 129/43 107/45  Pulse: 58 55  Temp: 36.9 C 36.8 C  Resp: 16 19    Complications: No apparent anesthesia complications

## 2015-07-23 NOTE — Anesthesia Procedure Notes (Signed)
Spinal Patient location during procedure: OR Staffing Anesthesiologist: Molli Barrows Resident/CRNA: Rolla Plate Performed by: resident/CRNA  Preanesthetic Checklist Completed: patient identified, site marked, surgical consent, pre-op evaluation, timeout performed, IV checked, risks and benefits discussed and monitors and equipment checked Spinal Block Patient position: sitting Prep: Betadine and site prepped and draped Patient monitoring: heart rate, continuous pulse ox, blood pressure and cardiac monitor Approach: midline Location: L4-5 Injection technique: single-shot Needle Needle type: Whitacre and Introducer  Needle gauge: 24 G Needle length: 9 cm Additional Notes Negative paresthesia. Negative blood return. Positive free-flowing CSF. Expiration date of kit checked and confirmed. Patient tolerated procedure well, without complications.

## 2015-07-23 NOTE — H&P (Signed)
Reviewed paper H+P, will be scanned into chart. No changes noted.  

## 2015-07-23 NOTE — Op Note (Signed)
07/23/2015  9:03 AM  PATIENT:  Kathleen Cox  74 y.o. female  PRE-OPERATIVE DIAGNOSIS:  DEGENERATIVE OSTEOARTHRITIS primary right hip osteoarthritis  POST-OPERATIVE DIAGNOSIS:  degenerative osteoarthritis right hip primary right hip osteoarthritis  PROCEDURE:  Procedure(s): TOTAL HIP ARTHROPLASTY ANTERIOR APPROACH (Right)  SURGEON: Laurene Footman, MD  ASSISTANTS: None  ANESTHESIA:   spinal  EBL:  Total I/O In: 100 [I.V.:100] Out: 325 [Urine:75; Blood:250]  BLOOD ADMINISTERED:none  DRAINS: none   LOCAL MEDICATIONS USED:  MARCAINE     SPECIMEN:  Source of Specimen:  Right femoral head  DISPOSITION OF SPECIMEN:  PATHOLOGY  COUNTS:  YES  TOURNIQUET:  * No tourniquets in log *  IMPLANTS: Medacta AMIS  2 collared stem, 50 millimeter Mpact cup DM with liner and M 28 mm head  DICTATION: .Dragon Dictation   The patient was brought to the operating room and after spinal anesthesia was obtained patient was placed on the operative table with the ipsilateral foot into the Medacta attachment, contralateral leg on a well-padded table. C-arm was brought in and preop template x-ray taken. After prepping and draping in usual sterile fashion appropriate patient identification and timeout procedures were completed. Anterior approach to the hip was obtained and centered over the greater trochanter and TFL muscle. The subcutaneous tissue was incised hemostasis being achieved by electrocautery. TFL fascia was incised and the muscle retracted laterally deep retractor placed. The lateral femoral circumflex vessels were identified and ligated. The anterior capsule was exposed and a capsulotomy performed. The neck was identified and a femoral neck cut carried out with a saw. The head was removed without difficulty and showed sclerotic femoral head and acetabulum. Reaming was carried out to 48 mm and a 50 mm cup trial gave appropriate tightness to the acetabular component a 50 Mpact cup DM was impacted  into position. The leg was then externally rotated and ischiofemoral and pubofemoral releases carried out. The femur was sequentially broached to a size 2, size 2 standard stem and M head trials were placed and the final components chosen. The 2 standard offset stem was inserted along with a M 28 mm head and 50 mm liner. The hip was reduced and was stable the wound was thoroughly irrigated with a dilute Betadine solution. The deep fascia view. Using a heavy Quill after infiltration of 30 cc of quarter percent Sensorcaine with epinephrine. 2-0 Quill to close the skin with skin staples Xeroform and honeycomb dressing applied  PLAN OF CARE: Admit to inpatient

## 2015-07-23 NOTE — Progress Notes (Signed)
Patients CrCl is <24ml/min and was ordered lovenox 40mg  daily. Per protocol patient will be transitioned to 30mg  daily due to renal function. Ramond Dial, Pharm.D Clinical Pharmacist

## 2015-07-23 NOTE — Evaluation (Addendum)
Physical Therapy Evaluation Patient Details Name: Kathleen Cox MRN: [id number] DOB: 1940/12/18 Today's Date: 07/23/2015   History of Present Illness  Pt admitted for R THR on 07/23/15.  Clinical Impression  Pt is a pleasant 74 year old female who was admitted for R THR. Pt performs bed mobility with supervision and transfers/ambulation with cga and rw. Pt slightly impulsive and requires cues to wait for therapist before beginning movement. Pt demonstrates deficits with strength/pain/mobility. Would benefit from skilled PT to address above deficits and promote optimal return to PLOF      Follow Up Recommendations Home health PT;Supervision/Assistance - 24 hour    Equipment Recommendations       Recommendations for Other Services       Precautions / Restrictions Precautions Precautions: Anterior Hip Precaution Booklet Issued: No Restrictions Weight Bearing Restrictions: Yes RLE Weight Bearing: Weight bearing as tolerated      Mobility  Bed Mobility Overal bed mobility: Needs Assistance Bed Mobility: Supine to Sit     Supine to sit: Supervision     General bed mobility comments: supine->sit with supervision performed. Safe technique performed with pt using handrail for assistance.  Transfers Overall transfer level: Needs assistance Equipment used: Rolling walker (2 wheeled) Transfers: Sit to/from Stand Sit to Stand: Min guard         General transfer comment: sit<>Stand with rw. Pt impulsive and requires cues to push from seated surface prior to standing as well as wait for therapist before beginning movement.  Ambulation/Gait Ambulation/Gait assistance: Min guard Ambulation Distance (Feet): 3 Feet Assistive device: Rolling walker (2 wheeled) Gait Pattern/deviations: Step-through pattern     General Gait Details: ambulated using rw and step to gait pattern. Pt requires cues for sequencing. Antalgic gait pattern performed.  Stairs             Wheelchair Mobility    Modified Rankin (Stroke Patients Only)       Balance Overall balance assessment: Needs assistance Sitting-balance support: Feet supported Sitting balance-Leahy Scale: Good     Standing balance support: Bilateral upper extremity supported Standing balance-Leahy Scale: Good                               Pertinent Vitals/Pain Pain Assessment: 0-10 Pain Score: 10-Worst pain ever Pain Location: R hip Pain Descriptors / Indicators: Constant;Operative site guarding Pain Intervention(s): Limited activity within patient's tolerance    Home Living Family/patient expects to be discharged to:: Private residence Living Arrangements: Spouse/significant other Available Help at Discharge: Family Type of Home: House Home Access: Stairs to enter Entrance Stairs-Rails:  (1 rail) Technical brewer of Steps: 2 Home Layout: One level Home Equipment: Environmental consultant - 2 wheels;Bedside commode Additional Comments: household ambulator    Prior Function Level of Independence: Independent with assistive device(s)         Comments: ambulating with rw prior to surgery     Hand Dominance        Extremity/Trunk Assessment   Upper Extremity Assessment: Overall WFL for tasks assessed           Lower Extremity Assessment: Generalized weakness (R LE grossly 3/5)         Communication   Communication: No difficulties  Cognition Arousal/Alertness: Awake/alert Behavior During Therapy: WFL for tasks assessed/performed Overall Cognitive Status: Within Functional Limits for tasks assessed  General Comments      Exercises Other Exercises Other Exercises: Pt performed supine ther-ex including R LE ankle pumps, quad sets, glut squeezes, and hip abd/add. Pt demonstrates min assist for correct technique. All ther-ex performed x 10 reps.      Assessment/Plan    PT Assessment Patient needs continued PT services  PT  Diagnosis Difficulty walking;Acute pain   PT Problem List Decreased strength;Pain;Decreased balance;Decreased mobility  PT Treatment Interventions Gait training;Therapeutic exercise   PT Goals (Current goals can be found in the Care Plan section) Acute Rehab PT Goals Patient Stated Goal: to go home PT Goal Formulation: With patient Time For Goal Achievement: 08/06/15 Potential to Achieve Goals: Good Additional Goals Additional Goal #1: Pt will be able to perform bed mobility/transfers with rw and supervision in order to improve functional mobility    Frequency BID   Barriers to discharge        Co-evaluation               End of Session Equipment Utilized During Treatment: Gait belt Activity Tolerance: Patient limited by pain Patient left: in chair (chair alarm set up; missing pad; RN and tech aware and apply) Nurse Communication: Mobility status         Time: [id number] PT Time Calculation (min) (ACUTE ONLY): 28 min   Charges:   PT Evaluation $Initial PT Evaluation Tier I: 1 Procedure PT Treatments $Therapeutic Exercise: 8-22 mins   PT G Codes:        Haneen Bernales 08/05/15, 4:06 PM Greggory Stallion, PT, DPT [phone]

## 2015-07-24 LAB — BASIC METABOLIC PANEL
ANION GAP: 7 (ref 5–15)
Anion gap: 7 (ref 5–15)
BUN: 20 mg/dL (ref 6–20)
BUN: 19 mg/dL (ref 6–20)
CALCIUM: 8.8 mg/dL — AB (ref 8.9–10.3)
CALCIUM: 9.3 mg/dL (ref 8.9–10.3)
CHLORIDE: 102 mmol/L (ref 101–111)
CO2: 23 mmol/L (ref 22–32)
CO2: 24 mmol/L (ref 22–32)
Chloride: 102 mmol/L (ref 101–111)
Creatinine, Ser: 1.54 mg/dL — ABNORMAL HIGH (ref 0.44–1.00)
Creatinine, Ser: 1.37 mg/dL — ABNORMAL HIGH (ref 0.44–1.00)
GFR calc Af Amer: 37 mL/min — ABNORMAL LOW (ref >60)
GFR calc Af Amer: 43 mL/min — ABNORMAL LOW (ref >60)
GFR calc non Af Amer: 37 mL/min — ABNORMAL LOW (ref >60)
GFR, EST NON AFRICAN AMERICAN: 32 mL/min — AB (ref >60)
GLUCOSE: 103 mg/dL — AB (ref 65–99)
GLUCOSE: 129 mg/dL — AB (ref 65–99)
POTASSIUM: 5.8 mmol/L — AB (ref 3.5–5.1)
Potassium: 4.9 mmol/L (ref 3.5–5.1)
Sodium: 132 mmol/L — ABNORMAL LOW (ref 135–145)
Sodium: 133 mmol/L — ABNORMAL LOW (ref 135–145)

## 2015-07-24 LAB — POTASSIUM: Potassium: 5.7 mmol/L — ABNORMAL HIGH (ref 3.5–5.1)

## 2015-07-24 MED ORDER — GUAIFENESIN 100 MG/5ML PO SOLN
5.0000 mL | Freq: Four times a day (QID) | ORAL | Status: DC | PRN
  Administered 2015-07-24 – 2015-07-27 (×6): 100 mg via ORAL
  Filled 2015-07-24 (×6): qty 10

## 2015-07-24 MED ORDER — ENOXAPARIN SODIUM 40 MG/0.4ML ~~LOC~~ SOLN: 40.0000 mg | SUBCUTANEOUS | Status: AC

## 2015-07-24 MED ORDER — SODIUM CHLORIDE 0.9 % IV SOLN
INTRAVENOUS | Status: DC
  Administered 2015-07-24: 18:00:00 via INTRAVENOUS

## 2015-07-24 MED ORDER — SODIUM POLYSTYRENE SULFONATE 15 GM/60ML PO SUSP
30.0000 g | Freq: Once | ORAL | Status: AC
  Administered 2015-07-24: 30 g via ORAL
  Filled 2015-07-24: qty 120

## 2015-07-24 MED ORDER — FLUTICASONE PROPIONATE 50 MCG/ACT NA SUSP
1.0000 | Freq: Every day | NASAL | Status: DC
Start: 2015-07-24 — End: 2015-07-28
  Administered 2015-07-24 – 2015-07-26 (×3): 1 via NASAL
  Filled 2015-07-24: qty 16

## 2015-07-24 MED ORDER — HYDROCODONE-ACETAMINOPHEN 10-325 MG PO TABS: 1.0000 | ORAL_TABLET | ORAL | Status: AC | PRN

## 2015-07-24 MED ORDER — GUAIFENESIN ER 600 MG PO TB12
600.0000 mg | ORAL_TABLET | Freq: Two times a day (BID) | ORAL | Status: DC
  Administered 2015-07-24 – 2015-07-28 (×9): 600 mg via ORAL
  Filled 2015-07-24 (×9): qty 1

## 2015-07-24 NOTE — Progress Notes (Signed)
Physical Therapy Treatment Patient Details Name: Kathleen Cox MRN: [id number] DOB: May 19, 1941 Today's Date: 07/24/2015    History of Present Illness Pt admitted for R THR on 07/23/15.    PT Comments    This session was significantly limited as patient has high K+ levels (5.7) and was experiencing spasming with any quadricep contraction on her RLE. Patient was able to tolerate minimal there-ex, but will need to be progressed with OOB mobility as tolerated to progress towards mobility goals.   Follow Up Recommendations  Home health PT;Supervision/Assistance - 24 hour (Pending resolution of symptomatic K+)     Equipment Recommendations       Recommendations for Other Services       Precautions / Restrictions Precautions Precautions: Anterior Hip Precaution Booklet Issued: No Restrictions Weight Bearing Restrictions: Yes RLE Weight Bearing: Weight bearing as tolerated    Mobility  Bed Mobility               General bed mobility comments: Deferred mobility secondary to high K+ levels and symptomatic spasms.   Transfers                    Ambulation/Gait                 Stairs            Wheelchair Mobility    Modified Rankin (Stroke Patients Only)       Balance                                    Cognition Arousal/Alertness: Awake/alert Behavior During Therapy: WFL for tasks assessed/performed;Anxious Overall Cognitive Status: Within Functional Limits for tasks assessed                      Exercises Total Joint Exercises Ankle Circles/Pumps: AROM;Both;10 reps Gluteal Sets: AROM;Both;20 reps Short Arc Quad: AROM;10 reps (Began to spasm at 10 reps) Hip ABduction/ADduction: AAROM;Both;5 reps (Began to spasm in R LE)    General Comments        Pertinent Vitals/Pain Pain Assessment:  (Patient has been getting muscle spasms, and complains of pain when they onset.) Pain Location: R thigh Pain Descriptors  / Indicators: Spasm Pain Intervention(s): Limited activity within patient's tolerance;Monitored during session    Home Living                      Prior Function            PT Goals (current goals can now be found in the care plan section) Acute Rehab PT Goals Patient Stated Goal: to go home PT Goal Formulation: With patient Time For Goal Achievement: 08/06/15 Potential to Achieve Goals: Good Additional Goals Additional Goal #1: Pt will be able to perform bed mobility/transfers with rw and supervision in order to improve functional mobility Progress towards PT goals: Progressing toward goals    Frequency  BID    PT Plan Current plan remains appropriate    Co-evaluation             End of Session Equipment Utilized During Treatment: Gait belt Activity Tolerance: Treatment limited secondary to medical complications (Comment);Patient limited by pain Patient left: in bed;with bed alarm set;with call bell/phone within reach     Time: 1600-1608 PT Time Calculation (min) (ACUTE ONLY): 8 min  Charges:  $Therapeutic Exercise: 8-22 mins  G Codes:      Kerman Passey, PT, DPT    07/24/2015, 4:23 PM

## 2015-07-24 NOTE — Progress Notes (Signed)
Clinical Social Worker (CSW) received SNF consult. PT is recommending home health. RN Case Manager aware of above. Please reconsult if future social work needs arise. CSW signing off.   Sierah Lacewell Morgan, LCSWA [phone] 

## 2015-07-24 NOTE — Anesthesia Postprocedure Evaluation (Signed)
Anesthesia Post Note  Patient: Kathleen Cox  Procedure(s) Performed: Procedure(s) (LRB): TOTAL HIP ARTHROPLASTY ANTERIOR APPROACH (Right)  Patient location during evaluation: Nursing Unit Anesthesia Type: Spinal Level of consciousness: awake, awake and alert, oriented and patient cooperative Pain management: satisfactory to patient Vital Signs Assessment: post-procedure vital signs reviewed and stable Respiratory status: spontaneous breathing and respiratory function stable Cardiovascular status: stable Postop Assessment: no headache Anesthetic complications: no    Last Vitals:  Filed Vitals:   07/24/15 0730 07/24/15 0732  BP: 135/110 122/44  Pulse: 69 68  Temp: 36.8 C   Resp:      Last Pain:  Filed Vitals:   07/24/15 0732  PainSc: 3                  Cristela Felt

## 2015-07-24 NOTE — Progress Notes (Signed)
Spoke with Dr.Menz regarding patient's potassium level of 5.8 Order received to repeat the potassium. Lab called for repeat lab.

## 2015-07-24 NOTE — Progress Notes (Signed)
Foley d/c'd at 0530 with 150cc urine.

## 2015-07-24 NOTE — Evaluation (Signed)
Occupational Therapy Evaluation Patient Details Name: Kathleen Cox MRN: [id number] DOB: 1941/06/24 Today's Date: 07/24/2015    History of Present Illness Pt admitted for R THR on 07/23/15.   Clinical Impression   This patient is a 74 year old female who came to [facility] for a R total hip replacement (anterior approach).  Patient lives  with her husband and had been independent with ADL and functional mobility. She  now requires ** assistance and would benefit from Occupational Therapy for ADL/functioal mobility training.      Follow Up Recommendations       Equipment Recommendations       Recommendations for Other Services       Precautions / Restrictions Precautions Precautions: Anterior Hip Restrictions Weight Bearing Restrictions: Yes RLE Weight Bearing: Weight bearing as tolerated      Mobility Bed Mobility                  Transfers                      Balance                                            ADL                                         General ADL Comments: Patient had been independent . Reviewed assistive devices for lower body dressing as she has them from last hip surgery. Patient familiar with the hip kit.      Vision     Perception     Praxis      Pertinent Vitals/Pain       Hand Dominance     Extremity/Trunk Assessment Upper Extremity Assessment Upper Extremity Assessment: Overall WFL for tasks assessed   Lower Extremity Assessment Lower Extremity Assessment: Defer to PT evaluation       Communication Communication Communication: No difficulties   Cognition Arousal/Alertness: Awake/alert Behavior During Therapy: WFL for tasks assessed/performed Overall Cognitive Status: Within Functional Limits for tasks assessed                     General Comments       Exercises       Shoulder Instructions      Home Living Family/patient  expects to be discharged to:: Private residence Living Arrangements: Spouse/significant other Available Help at Discharge: Family Type of Home: House Home Access: Stairs to enter Technical brewer of Steps: 2 Entrance Stairs-Rails:  (1 railing) Home Layout: One level               Home Equipment: Walker - 2 wheels;Bedside commode   Additional Comments: household ambulator      Prior Functioning/Environment Level of Independence: Independent with assistive device(s)        Comments: ambulating with rw prior to surgery    OT Diagnosis: Acute pain   OT Problem List:     OT Treatment/Interventions: Self-care/ADL training    OT Goals(Current goals can be found in the care plan section) Acute Rehab OT Goals Patient Stated Goal: to go home Time For Goal Achievement: 08/07/15 Potential to Achieve Goals: Good  OT Frequency: Min 1X/week   Barriers to D/C:  Co-evaluation              End of Session Equipment Utilized During Treatment:  (hip kit)  Activity Tolerance:   Patient left: in chair;with call bell/phone within reach;with chair alarm set;with family/visitor present   Time: 1010-1032 OT Time Calculation (min): 22 min Charges:  OT General Charges $OT Visit: 1 Procedure OT Evaluation $Initial OT Evaluation Tier I: 1 Procedure OT Treatments $Self Care/Home Management : 8-22 mins G-Codes:    Myrene Galas, MS/OTR/L  07/24/2015, 10:38 AM

## 2015-07-24 NOTE — Care Management Note (Addendum)
Case Management Note  Patient Details  Name: Kathleen Cox MRN: [id number] Date of Birth: 08-17-1940  Subjective/Objective:                  Met with patient briefly as OT was beginning to work with patient. She is concerned that she "may need SNF" although PT is recommending HHPT. I discussed this with patient and she stressed that she "had to be placed on O2 this morning because of the way I slept last night". She does however smoke. O2 is new. She lives with her husband who has taken the next two weeks off to help her. She has a rolling walker and bedside commode at home for use. She uses [name] for Rx. She asked that nothing be started for discharge to home.   Action/Plan:  List of home health agencies provided to patient's husband along with my contact card. Notified patient of urgency to establish with home health agencies to prevent delay of care regardless of discharge disposition- she agreed to follow up this afternoon. Lovenox has not been called in per patient request- No pharmacy has her insurance card on file due to transition of pharmacies per patient. RNCM will follow up with patient.   Expected Discharge Date:                  Expected Discharge Plan:     In-House Referral:     Discharge planning Services  CM Consult  Post Acute Care Choice:  Home Health, Durable Medical Equipment Choice offered to:  Patient, Spouse  DME Arranged:    DME Agency:     HH Arranged:    [place] Agency:     Status of Service:  In process, will continue to follow  Medicare Important Message Given:    Date Medicare IM Given:    Medicare IM give by:    Date Additional Medicare IM Given:    Additional Medicare Important Message give by:     If discussed at [place] of Stay Meetings, dates discussed:    Additional Comments: Met with patient and husband- benefits explained regarding SNF vs. HHPT. She picked [facility]. Referral made to [name] with [facility].   [name], RN 07/24/2015, 11:05 AM

## 2015-07-24 NOTE — Discharge Summary (Signed)
Physician Discharge Summary  Patient ID: Kathleen Cox MRN: [id number] DOB/AGE: [birth year] 76 y.o.  Admit date: 07/23/2015 Discharge date: 07/27/2015  Admission Diagnoses:  DEGENERATIVE OSTEOARTHRITIS   Discharge Diagnoses: Patient Active Problem List   Diagnosis Date Noted  . Primary osteoarthritis of right hip 07/23/2015    Past Medical History  Diagnosis Date  . Complication of anesthesia     pt heard the drill during surgery.  . Coronary artery disease   . Hypertension   . COPD (chronic obstructive pulmonary disease) ([place])   . Depression   . Chronic kidney disease 2015    stage 3  . GERD (gastroesophageal reflux disease)   . Arthritis      Transfusion: None   Consultants (if any): Treatment Team:  Demetrios Loll, MD Bettey Costa, MD  Discharged Condition: Improved  Hospital Course: Kathleen Cox is an 74 y.o. female who was admitted 07/23/2015 with a diagnosis of right hip osteoarthritis and went to the operating room on 07/23/2015 and underwent the above named procedures.    Surgeries: Procedure(s): TOTAL HIP ARTHROPLASTY ANTERIOR APPROACH on 07/23/2015 Patient tolerated the surgery well. Taken to PACU where she was stabilized and then transferred to the orthopedic floor.  Started on Lovenox 30 q 12 hrs. Foot pumps applied bilaterally at 80 mm. Heels elevated on bed with rolled towels. No evidence of DVT. Negative Homan. Physical therapy started on day #1 for gait training and transfer. OT started day #1 for ADL and assisted devices.  Patient's foley was d/c on day #1. Patient's IV was d/c on day #2.  On post op day #3 Patient was found to have bradycardia with shortness of breath. Chest x-ray showed mild right lower lobe pneumonia.  She was started on IV Rocephin and oral azithromycin.  She was given steroids taper.  Breathing did improve.  On postop day #4, patient was stable and ready for discharge to home with home health physical therapy.  Oxygen order was  given.  She will continue with azithromycin and 5 day prednisone taper.  She will follow up with PCP.  Follow up with Orthopedics in 2 weeks for staple removal.    Implants: Medacta AMIS 2 collared stem, 50 millimeter Mpact cup DM with liner and M 28 mm head  She was given perioperative antibiotics:      Anti-infectives    Start     Dose/Rate Route Frequency Ordered Stop   07/27/15 0900  cefTRIAXone (ROCEPHIN) 1 g in dextrose 5 % 50 mL IVPB     1 g 100 mL/hr over 30 Minutes Intravenous Every 24 hours 07/27/15 0809     07/27/15 0000  azithromycin (ZITHROMAX Z-PAK) 250 MG tablet     500 mg Oral Daily 07/27/15 1139     07/26/15 0930  azithromycin (ZITHROMAX) tablet 500 mg     500 mg Oral  Once 07/26/15 0925 07/26/15 1109   07/23/15 1200  clindamycin (CLEOCIN) IVPB 600 mg     600 mg 100 mL/hr over 30 Minutes Intravenous Every 6 hours 07/23/15 1116 07/24/15 0017   07/23/15 0559  clindamycin (CLEOCIN) 900 MG/50ML IVPB    Comments:  Idamae Lusher: cabinet override      07/23/15 0559 07/23/15 1759   07/23/15 0215  clindamycin (CLEOCIN) IVPB 900 mg     900 mg 100 mL/hr over 30 Minutes Intravenous  Once 07/23/15 0207 07/23/15 0753    .  She was given sequential compression devices, early ambulation, and Lovenox for DVT prophylaxis.  She benefited maximally from the hospital stay and there were no complications.    Recent vital signs:  Filed Vitals:   07/27/15 0732 07/27/15 1009  BP: 128/45   Pulse: 65 64  Temp: 98.4 F (36.9 C)   Resp: 18     Recent laboratory studies:  Lab Results  Component Value Date   HGB 9.7* 07/27/2015   HGB 9.8* 07/26/2015   HGB 10.6* 07/25/2015   Lab Results  Component Value Date   WBC 6.0 07/27/2015   PLT 189 07/27/2015   Lab Results  Component Value Date   INR 1.02 07/14/2015   Lab Results  Component Value Date   NA 136 07/26/2015   K 4.6 07/26/2015   CL 105 07/26/2015   CO2 27 07/26/2015   BUN 15 07/26/2015   CREATININE 1.05*  07/26/2015   GLUCOSE 85 07/26/2015    Discharge Medications:     Medication List    STOP taking these medications        HYDROcodone-acetaminophen 5-325 MG tablet  Commonly known as:  NORCO/VICODIN  Replaced by:  HYDROcodone-acetaminophen 10-325 MG tablet     losartan 100 MG tablet  Commonly known as:  COZAAR      TAKE these medications        ALPRAZolam 0.5 MG 24 hr tablet  Commonly known as:  XANAX XR  Take 0.5 mg by mouth daily as needed for anxiety.     aspirin 81 MG tablet  Take 81 mg by mouth daily. In am     azithromycin 250 MG tablet  Commonly known as:  ZITHROMAX Z-PAK  Take 2 tablets (500 mg total) by mouth daily. Take 2 tablets (500 mg) daily x 6 days     calcium citrate-vitamin D 315-200 MG-UNIT tablet  Commonly known as:  CITRACAL+D  Take 2 tablets by mouth daily.     diltiazem 240 MG 24 hr capsule  Commonly known as:  DILACOR XR  Take 240 mg by mouth daily. In am     enoxaparin 40 MG/0.4ML injection  Commonly known as:  LOVENOX  Inject 0.4 mLs (40 mg total) into the skin daily.     HYDROcodone-acetaminophen 10-325 MG tablet  Commonly known as:  NORCO  Take 1-2 tablets by mouth every 4 (four) hours as needed (breakthrough pain).     omeprazole 20 MG tablet  Commonly known as:  PRILOSEC OTC  Take 20 mg by mouth daily. In am     predniSONE 10 MG tablet  Commonly known as:  DELTASONE  5 day taper. 5, 4, 3, 2, 1.     simvastatin 10 MG tablet  Commonly known as:  ZOCOR  Take 10 mg by mouth at bedtime. At bedtime.     tiotropium 18 MCG inhalation capsule  Commonly known as:  SPIRIVA  Place 18 mcg into inhaler and inhale daily as needed.     VITAMIN C PO  Take 1 tablet by mouth daily.        Diagnostic Studies: Dg Chest 1 View  07/27/2015  CLINICAL DATA:  Pneumonia EXAM: CHEST 1 VIEW COMPARISON:  07/26/2015 FINDINGS: Cardiomegaly. Continued left basilar airspace opacity and small left effusion, not significantly changed. No confluent  opacity on the right. No acute bony abnormality. IMPRESSION: Stable left lower lobe infiltrate with small left effusion. Electronically Signed   By: Rolm Baptise M.D.   On: 07/27/2015 07:26   Dg Chest Port 1 View  07/26/2015  CLINICAL DATA:  Decreased oxygen  saturation levels EXAM: PORTABLE CHEST 1 VIEW COMPARISON:  October 18, 2008 FINDINGS: There is left base consolidation with small left effusion. Lungs elsewhere clear. Heart is enlarged with pulmonary vascularity within normal limits. No adenopathy. There is extensive atherosclerotic calcification in aorta. IMPRESSION: Medial opacity left base consistent with focal airspace consolidation with small effusion. Lungs elsewhere clear. Stable cardiomegaly. Electronically Signed   By: Lowella Grip III M.D.   On: 07/26/2015 08:13   Dg Hip Operative Unilat With Pelvis Right  07/23/2015  CLINICAL DATA:  ORIF right hip EXAM: OPERATIVE right HIP (WITH PELVIS IF PERFORMED) 1 VIEWS TECHNIQUE: Fluoroscopic spot image(s) were submitted for interpretation post-operatively. COMPARISON:  None. FINDINGS: Three AP images of the right hip show placement of a total arthroplasty. There is partial coverage of the arthroplasty with no visualized periprosthetic fracture or dislocation. IMPRESSION: Fluoroscopy for right hip arthroplasty. Electronically Signed   By: Monte Fantasia M.D.   On: 07/23/2015 08:56   Dg Hip Unilat W Or W/o Pelvis 2-3 Views Right  07/23/2015  CLINICAL DATA:  Postop right hip EXAM: DG HIP (WITH OR WITHOUT PELVIS) 2-3V RIGHT COMPARISON:  None. FINDINGS: 2 portable views of the right hip submitted. There is right hip prosthesis with anatomic alignment. Postsurgical changes are noted with lateral skin staples. Small amount of periarticular soft tissue air. IMPRESSION: Right hip prosthesis with anatomic alignment. Postsurgical changes are noted. Electronically Signed   By: Lahoma Crocker M.D.   On: 07/23/2015 09:56    Disposition:     Follow-up  Information    Follow up with MENZ,MICHAEL, MD In 2 weeks.   Specialty:  Orthopedic Surgery   Why:  For staple removal and skin check   Contact information:   [place] [address] [phone]        Signed: Dorise Hiss [facility] 07/27/2015, 11:45 AM

## 2015-07-24 NOTE — Progress Notes (Signed)
Physical Therapy Treatment Patient Details Name: Kathleen Cox MRN: [id number] DOB: July 01, 1941 Today's Date: 07/24/2015    History of Present Illness Pt admitted for R THR on 07/23/15.    PT Comments    Pt is making good progress towards goals with improved technique with OOB mobility, however pt on room air and becomes fatigued with small bout of ambulation. RN in room to assess, O2 sats decreased to 83% with exertion. Pt placed on 2L of O2 with increased sats to 92%. Pt encouraged for pursed lip breathing. Will keep on O2 for further therapy in PM session.  Follow Up Recommendations  Home health PT;Supervision/Assistance - 24 hour     Equipment Recommendations       Recommendations for Other Services       Precautions / Restrictions Precautions Precautions: Anterior Hip Precaution Booklet Issued: No Restrictions Weight Bearing Restrictions: Yes RLE Weight Bearing: Weight bearing as tolerated    Mobility  Bed Mobility Overal bed mobility: Needs Assistance Bed Mobility: Supine to Sit     Supine to sit: Supervision     General bed mobility comments: supine->sit with supervision performed. Safe technique performed with pt using handrail for assistance.  Transfers Overall transfer level: Needs assistance Equipment used: Rolling walker (2 wheeled) Transfers: Sit to/from Stand Sit to Stand: Min guard         General transfer comment: sit<>Stand with rw. Pt impulsive and requires cues to push from seated surface prior to standing as well as wait for therapist before beginning movement.  Ambulation/Gait Ambulation/Gait assistance: Min guard Ambulation Distance (Feet): 3 Feet Assistive device: Rolling walker (2 wheeled) Gait Pattern/deviations: Step-to pattern     General Gait Details: ambulated using rw and step to gait pattern. Pt became increasinly SOB with exertion, therefore further distance deferred at this time. Pt on room air for ambulation.   Stairs             Wheelchair Mobility    Modified Rankin (Stroke Patients Only)       Balance                                    Cognition Arousal/Alertness: Awake/alert Behavior During Therapy: WFL for tasks assessed/performed Overall Cognitive Status: Within Functional Limits for tasks assessed                      Exercises Other Exercises Other Exercises: supine ther-ex performed including R LE ankle pumps, quad sets, and glut squeezes. All ther-ex performed x 12 reps with cues for correct technique. Further ther-ex deferred as pt continues to be SOB, RN notified and entered room.    General Comments        Pertinent Vitals/Pain Pain Assessment: No/denies pain    Home Living Family/patient expects to be discharged to:: Private residence Living Arrangements: Spouse/significant other Available Help at Discharge: Family Type of Home: House Home Access: Stairs to enter Entrance Stairs-Rails:  (1 railing) Home Layout: One level Home Equipment: Environmental consultant - 2 wheels;Bedside commode Additional Comments: household ambulator    Prior Function Level of Independence: Independent with assistive device(s)      Comments: ambulating with rw prior to surgery   PT Goals (current goals can now be found in the care plan section) Acute Rehab PT Goals Patient Stated Goal: to go home PT Goal Formulation: With patient Time For Goal Achievement: 08/06/15 Potential to Achieve Goals:  Good Progress towards PT goals: Progressing toward goals    Frequency  BID    PT Plan Current plan remains appropriate    Co-evaluation             End of Session Equipment Utilized During Treatment: Gait belt Activity Tolerance: Patient limited by pain;Patient limited by fatigue Patient left: in chair;with chair alarm set     Time: [phone] PT Time Calculation (min) (ACUTE ONLY): 12 min  Charges:  $Gait Training: 8-22 mins                    G Codes:       Havannah Streat August 16, 2015, 11:42 AM  Greggory Stallion, PT, DPT [phone]

## 2015-07-24 NOTE — Progress Notes (Signed)
Spoke with Dr.Menz regarding getting chest x-ray - no new orders given for now. Will continue to monitor the patient.

## 2015-07-24 NOTE — Progress Notes (Signed)
   Subjective: 1 Day Post-Op Procedure(s) (LRB): TOTAL HIP ARTHROPLASTY ANTERIOR APPROACH (Right) Patient reports pain as 2 on 0-10 scale.   Patient is well, and has had no acute complaints or problems We will start therapy today.  Plan is to go Rehab after hospital stay.  Objective: Vital signs in last 24 hours: Temp:  [97.1 F (36.2 C)-99 F (37.2 C)] 98.3 F (36.8 C) (12/09 0730) Pulse Rate:  [52-69] 68 (12/09 0732) Resp:  [16-23] 18 (12/08 1655) BP: (96-135)/(40-110) 122/44 mmHg (12/09 0732) SpO2:  [83 %-100 %] 83 % (12/09 0730) Weight:  [62.097 kg (136 lb 14.4 oz)] 62.097 kg (136 lb 14.4 oz) (12/08 1128)  Intake/Output from previous day: 12/08 0701 - 12/09 0700 In: 3023.8 [P.O.:570; I.V.:2453.8] Out: 1025 [Urine:775; Blood:250] Intake/Output this shift:     Recent Labs  07/23/15 0943  HGB 10.4*    Recent Labs  07/23/15 0943  WBC 5.1  RBC 3.43*  HCT 32.0*  PLT 245    Recent Labs  07/23/15 0943  CREATININE 1.69*   No results for input(s): LABPT, INR in the last 72 hours.  EXAM General - Patient is Alert, Appropriate and Oriented Extremity - Neurologically intact Neurovascular intact Sensation intact distally Intact pulses distally Dorsiflexion/Plantar flexion intact Dressing - dressing C/D/I and scant drainage Motor Function - intact, moving foot and toes well on exam.   Past Medical History  Diagnosis Date  . Complication of anesthesia     pt heard the drill during surgery.  . Coronary artery disease   . Hypertension   . COPD (chronic obstructive pulmonary disease) ([place])   . Depression   . Chronic kidney disease 2015    stage 3  . GERD (gastroesophageal reflux disease)   . Arthritis     Assessment/Plan:   1 Day Post-Op Procedure(s) (LRB): TOTAL HIP ARTHROPLASTY ANTERIOR APPROACH (Right) Active Problems:   Primary osteoarthritis of right hip   Acute post op blood loss anemia     Estimated body mass index is 26.74 kg/(m^2) as  calculated from the following:   Height as of this encounter: 5' (1.524 m).   Weight as of this encounter: 62.097 kg (136 lb 14.4 oz). Advance diet Up with therapy  Needs BM Recheck labs in the am  DVT Prophylaxis - Lovenox, Foot Pumps and TED hose Weight-Bearing as tolerated to right leg D/C O2 and Pulse OX and try on Room Air  T. Rachelle Hora, PA-C [place] 07/24/2015, 8:07 AM

## 2015-07-24 NOTE — Progress Notes (Signed)
Spoke with Dr.Menz regarding patient's repeat potassium of 5.7 Order received for medical consult for hyperkalemia.

## 2015-07-24 NOTE — Consult Note (Signed)
[place] at [place] NAME: Kathleen Cox    MR#:  [id number]  DATE OF BIRTH:  [birth year]  DATE OF ADMISSION:  07/23/2015  PRIMARY CARE PHYSICIAN: Kirk Ruths., MD   REQUESTING/REFERRING PHYSICIAN: Dr. Rudene Christians.  CHIEF COMPLAINT:  No chief complaint on file.  hyperkalemia.  HISTORY OF PRESENT ILLNESS:  Kathleen Cox  is a 74 y.o. female with a known history of CAD, CKD stage 3, COPD. The patient got right total hip arthroplasty yesterday. She was found to have hyperkalemia with potassium at 5.8 today. Orthopedic surgeon, Dr. Rudene Christians request medical consult for hyperkalemia. The patient is alert, awake and oriented. She denies any symptoms.  PAST MEDICAL HISTORY:   Past Medical History  Diagnosis Date  . Complication of anesthesia     pt heard the drill during surgery.  . Coronary artery disease   . Hypertension   . COPD (chronic obstructive pulmonary disease) ([place])   . Depression   . Chronic kidney disease 2015    stage 3  . GERD (gastroesophageal reflux disease)   . Arthritis     PAST SURGICAL HISTOIRY:   Past Surgical History  Procedure Laterality Date  . Cardiac catheterization  2006    stent placement  . Carotid endarterectomy Right 2013  . Abdominal hysterectomy    . Joint replacement Left 2012    hip  . Cataract extraction w/ intraocular lens implant Right 2010  . Cataract extraction w/ intraocular lens implant Left 2011  . Total hip arthroplasty Right 07/23/2015    Procedure: TOTAL HIP ARTHROPLASTY ANTERIOR APPROACH;  Surgeon: Hessie Knows, MD;  Location: [facility] ORS;  Service: Orthopedics;  Laterality: Right;    SOCIAL HISTORY:   Social History  Substance Use Topics  . Smoking status: Current Every Day Smoker -- 0.50 packs/day  . Smokeless tobacco: Not on file  . Alcohol Use: No    FAMILY HISTORY:  History reviewed. No pertinent family history.  DRUG ALLERGIES:   Allergies  Allergen Reactions  .  Amoxicillin Diarrhea  . Levofloxacin Other (See Comments)    Muscle pain.  Marland Kitchen Percocet [Oxycodone-Acetaminophen] Other (See Comments)    hallucinate  . Neosporin [Neomycin-Bacitracin Zn-Polymyx] Rash    REVIEW OF SYSTEMS:  CONSTITUTIONAL: No fever, fatigue or weakness.  EYES: No blurred or double vision.  EARS, NOSE, AND THROAT: No tinnitus or ear pain.  RESPIRATORY: No cough, shortness of breath, wheezing or hemoptysis.  CARDIOVASCULAR: No chest pain, orthopnea, edema.  GASTROINTESTINAL: No nausea, vomiting, diarrhea or abdominal pain.  GENITOURINARY: No dysuria, hematuria.  ENDOCRINE: No polyuria, nocturia,  HEMATOLOGY: No anemia, easy bruising or bleeding SKIN: No rash or lesion. MUSCULOSKELETAL: No joint pain or arthritis.   NEUROLOGIC: No tingling, numbness, weakness.  PSYCHIATRY: No anxiety or depression.   MEDICATIONS AT HOME:   Prior to Admission medications   Medication Sig Start Date End Date Taking? Authorizing Provider  diltiazem (DILACOR XR) 240 MG 24 hr capsule Take 240 mg by mouth daily. In am   Yes Historical Provider, MD  losartan (COZAAR) 100 MG tablet Take 100 mg by mouth daily. In am   Yes Historical Provider, MD  omeprazole (PRILOSEC OTC) 20 MG tablet Take 20 mg by mouth daily. In am   Yes Historical Provider, MD  tiotropium (SPIRIVA) 18 MCG inhalation capsule Place 18 mcg into inhaler and inhale daily as needed.   Yes Historical Provider, MD  ALPRAZolam (XANAX XR) 0.5 MG 24 hr tablet Take 0.5 mg  by mouth daily as needed for anxiety.    Historical Provider, MD  Ascorbic Acid (VITAMIN C PO) Take 1 tablet by mouth daily.    Historical Provider, MD  aspirin 81 MG tablet Take 81 mg by mouth daily. In am    Historical Provider, MD  calcium citrate-vitamin D (CITRACAL+D) 315-200 MG-UNIT tablet Take 2 tablets by mouth daily.    Historical Provider, MD  enoxaparin (LOVENOX) 40 MG/0.4ML injection Inject 0.4 mLs (40 mg total) into the skin daily. 07/24/15   Duanne Guess, PA-C  HYDROcodone-acetaminophen [facility]) 10-325 MG tablet Take 1-2 tablets by mouth every 4 (four) hours as needed (breakthrough pain). 07/24/15   Duanne Guess, PA-C  HYDROcodone-acetaminophen (NORCO/VICODIN) 5-325 MG tablet Take 1 tablet by mouth every 6 (six) hours as needed for moderate pain.    Historical Provider, MD  simvastatin (ZOCOR) 10 MG tablet Take 10 mg by mouth at bedtime. At bedtime.    Historical Provider, MD      VITAL SIGNS:  Blood pressure 156/52, pulse 74, temperature 99.1 F (37.3 C), temperature source Oral, resp. rate 18, height 5' (1.524 m), weight 62.097 kg (136 lb 14.4 oz), SpO2 98 %.  PHYSICAL EXAMINATION:  GENERAL:  74 y.o.-year-old patient lying in the bed with no acute distress.  EYES: Pupils equal, round, reactive to light and accommodation. No scleral icterus. Extraocular muscles intact.  HEENT: Head atraumatic, normocephalic. Oropharynx and nasopharynx clear. Moist oral mucosa. NECK:  Supple, no jugular venous distention. No thyroid enlargement, no tenderness.  LUNGS: Normal breath sounds bilaterally, no wheezing, rales,rhonchi or crepitation. No use of accessory muscles of respiration.  CARDIOVASCULAR: S1, S2 normal. No murmurs, rubs, or gallops.  ABDOMEN: Soft, nontender, nondistended. Bowel sounds present. No organomegaly or mass.  EXTREMITIES: No pedal edema, cyanosis, or clubbing.  NEUROLOGIC: Cranial nerves II through XII are intact. Muscle strength 4/5 in all extremities. Sensation intact. Gait not checked.  PSYCHIATRIC: The patient is alert and oriented x 3.  SKIN: No obvious rash, lesion, or ulcer.   LABORATORY PANEL:   CBC  Recent Labs Lab 07/23/15 0943  WBC 5.1  HGB 10.4*  HCT 32.0*  PLT 245   ------------------------------------------------------------------------------------------------------------------  Chemistries   Recent Labs Lab 07/24/15 0934 07/24/15 1219  NA 132*  --   K 5.8* 5.7*  CL 102  --   CO2 23  --    GLUCOSE 103*  --   BUN 20  --   CREATININE 1.54*  --   CALCIUM 8.8*  --    ------------------------------------------------------------------------------------------------------------------  Cardiac Enzymes No results for input(s): TROPONINI in the last 168 hours. ------------------------------------------------------------------------------------------------------------------  RADIOLOGY:  Dg Hip Operative Unilat With Pelvis Right  07/23/2015  CLINICAL DATA:  ORIF right hip EXAM: OPERATIVE right HIP (WITH PELVIS IF PERFORMED) 1 VIEWS TECHNIQUE: Fluoroscopic spot image(s) were submitted for interpretation post-operatively. COMPARISON:  None. FINDINGS: Three AP images of the right hip show placement of a total arthroplasty. There is partial coverage of the arthroplasty with no visualized periprosthetic fracture or dislocation. IMPRESSION: Fluoroscopy for right hip arthroplasty. Electronically Signed   By: Monte Fantasia M.D.   On: 07/23/2015 08:56   Dg Hip Unilat W Or W/o Pelvis 2-3 Views Right  07/23/2015  CLINICAL DATA:  Postop right hip EXAM: DG HIP (WITH OR WITHOUT PELVIS) 2-3V RIGHT COMPARISON:  None. FINDINGS: 2 portable views of the right hip submitted. There is right hip prosthesis with anatomic alignment. Postsurgical changes are noted with lateral skin staples. Small amount  of periarticular soft tissue air. IMPRESSION: Right hip prosthesis with anatomic alignment. Postsurgical changes are noted. Electronically Signed   By: Lahoma Crocker M.D.   On: 07/23/2015 09:56    EKG:   Orders placed or performed during the hospital encounter of 07/14/15  . EKG 12-Lead  . EKG 12-Lead    IMPRESSION AND PLAN:   Hyperkalemia. I ordered Kayexalate, and follow-up potassium level tonight.  Hyponatremia. Start IV normal saline with gentle rehydration and follow-up BMP.  CKD stage III. Stable. Anemia.  Follow-up CBC. CAD. Continue aspirin and Zocor. COPD. Stable. Nebulizer when  necessary. Tobacco abuse. Smoking cessation was counseled for 3 minutes  . All the records are reviewed and case discussed with Consulting provider. Management plans discussed with the patient, family and they are in agreement.  CODE STATUS: Full code  TOTAL TIME TAKING CARE OF THIS PATIENT: 50 minutes.    Demetrios Loll M.D on 07/24/2015 at 5:21 PM  Between 7am to 6pm - Pager - [phone]  After 6pm go to [email or web address] - password [email or web address] [facility]  Office  [phone]  CC: Primary care Physician: Kirk Ruths., MD

## 2015-07-24 NOTE — Discharge Instructions (Signed)

## 2015-07-25 LAB — BASIC METABOLIC PANEL
ANION GAP: 5 (ref 5–15)
BUN: 18 mg/dL (ref 6–20)
CO2: 27 mmol/L (ref 22–32)
CREATININE: 1.16 mg/dL — AB (ref 0.44–1.00)
Calcium: 8.5 mg/dL — ABNORMAL LOW (ref 8.9–10.3)
Chloride: 105 mmol/L (ref 101–111)
GFR, EST AFRICAN AMERICAN: 52 mL/min — AB (ref >60)
GFR, EST NON AFRICAN AMERICAN: 45 mL/min — AB (ref >60)
Glucose, Bld: 104 mg/dL — ABNORMAL HIGH (ref 65–99)
Potassium: 5 mmol/L (ref 3.5–5.1)
SODIUM: 137 mmol/L (ref 135–145)

## 2015-07-25 LAB — CBC
HCT: 31.3 % — ABNORMAL LOW (ref 35.0–47.0)
HEMOGLOBIN: 10.6 g/dL — AB (ref 12.0–16.0)
MCH: 31.6 pg (ref 26.0–34.0)
MCHC: 34 g/dL (ref 32.0–36.0)
MCV: 93.2 fL (ref 80.0–100.0)
Platelets: 223 10*3/uL (ref 150–440)
RBC: 3.35 MIL/uL — ABNORMAL LOW (ref 3.80–5.20)
RDW: 15.6 % — AB (ref 11.5–14.5)
WBC: 7.4 10*3/uL (ref 3.6–11.0)

## 2015-07-25 MED ORDER — ASPIRIN 325 MG PO TABS
325.0000 mg | ORAL_TABLET | Freq: Every day | ORAL | Status: DC
  Administered 2015-07-25: 325 mg via ORAL
  Filled 2015-07-25 (×2): qty 1

## 2015-07-25 MED ORDER — ENOXAPARIN SODIUM 40 MG/0.4ML ~~LOC~~ SOLN
40.0000 mg | SUBCUTANEOUS | Status: DC
  Administered 2015-07-26 – 2015-07-28 (×3): 40 mg via SUBCUTANEOUS
  Filled 2015-07-25 (×3): qty 0.4

## 2015-07-25 MED ORDER — HYDRALAZINE HCL 25 MG PO TABS
25.0000 mg | ORAL_TABLET | Freq: Two times a day (BID) | ORAL | Status: DC
  Administered 2015-07-25 – 2015-07-28 (×6): 25 mg via ORAL
  Filled 2015-07-25 (×7): qty 1

## 2015-07-25 NOTE — Care Management Note (Signed)
Case Management Note  Patient Details  Name: Kathleen Cox MRN: [id number] Date of Birth: Aug 30, 1940  Subjective/Objective:   Discussed discharge planning with Kathleen Cox. Kathleen Cox chose [facility] as her home health services provider. Kathleen Cox already has a rolling walker and a BSC at home. Kathleen Cox qualifies for continuous home oxygen and Kathleen Cox at [place] was given a heads up referral for new oxygen order tomorrow for a portable oxygen tank and a home oxygen set-up. Will fax new oxygen order to [facility] as soon as it is available from the discharging physician tomorrow.                  Action/Plan:   Expected Discharge Date:                  Expected Discharge Plan:     In-House Referral:     Discharge planning Services  CM Consult  Post Acute Care Choice:  Home Health, Durable Medical Equipment Choice offered to:  Patient, Spouse  DME Arranged:    DME Agency:     HH Arranged:    [place] Agency:     Status of Service:  In process, will continue to follow  Medicare Important Message Given:    Date Medicare IM Given:    Medicare IM give by:    Date Additional Medicare IM Given:    Additional Medicare Important Message give by:     If discussed at [place] of Stay Meetings, dates discussed:    Additional Comments:  Xochilth Standish A, RN 07/25/2015, 3:33 PM

## 2015-07-25 NOTE — Progress Notes (Signed)
Dr Benjie Karvonen notified of decreased heart rate of 41. Telemetry placed, cardiology consult placed.

## 2015-07-25 NOTE — Progress Notes (Signed)
PT Cancellation Note  Patient Details Name: Kathleen Cox MRN: [id number] DOB: 1940/12/17   Cancelled Treatment:    Reason Eval/Treat Not Completed: Medical issues which prohibited therapy. PT entered room and took vitals, patient demonstrating appropriate O2 sats (90%) however on portable pulse-ox (39 bpm for heart rate). Brought in [place] and again patient with bradycardia on pulse-ox (36-39 bpm). Alerted RN and paged on call round orthopedic. Patient is not currently on a telemetry monitor, and therefore PT will defer mobility until patient resumes more normalized cardiovascular signs.   Kerman Passey, PT, DPT    07/25/2015, 4:23 PM

## 2015-07-25 NOTE — Progress Notes (Signed)
Anticoagulation Policy  Patient is a 74 yo female originally ordered Lovenox 40 mg subq daily for DVT prophylaxis. Lovenox dose was reduced due to previous [id number] mL/min per policy.  Est CrCl today ~34.5 mL/min.  Will transition patient back to Lovenox 40 mg subq [id number] per policy.   Murrell Converse, PharmD Clinical Pharmacist 07/25/2015

## 2015-07-25 NOTE — Progress Notes (Signed)
Patient paced on cardiac monitoring, rhythm shows a fib 88, Dr Benjie Karvonen notified.

## 2015-07-25 NOTE — Progress Notes (Signed)
Tele called, patient is NSR 63, states that a fib was interpreted earlier because of artifact. Dr Benjie Karvonen ordered to d/c cardiology consult and to keep cardiac monitoring.

## 2015-07-25 NOTE — Progress Notes (Addendum)
   Subjective: 2 Days Post-Op Procedure(s) (LRB): TOTAL HIP ARTHROPLASTY ANTERIOR APPROACH (Right) Patient reports pain as 2 on 0-10 scale.   Patient is well, and has had no acute complaints or problems  No CP, SOB, ABD pain. We will continue therapy today.  Plan is to go Rehab after hospital stay.  Objective: Vital signs in last 24 hours: Temp:  [98.3 F (36.8 C)-99.1 F (37.3 C)] 98.3 F (36.8 C) (12/10 0352) Pulse Rate:  [52-74] 52 (12/10 0352) Resp:  [18] 18 (12/10 0352) BP: (122-156)/(44-110) 151/63 mmHg (12/10 0352) SpO2:  [81 %-98 %] 95 % (12/10 0352)  Intake/Output from previous day: 12/09 0701 - 12/10 0700 In: 1286.3 [I.V.:1286.3] Out: 300 [Urine:300] Intake/Output this shift: Total I/O In: 701.3 [I.V.:701.3] Out: 100 [Urine:100]   Recent Labs  07/23/15 0943 07/25/15 0355  HGB 10.4* 10.6*    Recent Labs  07/23/15 0943 07/25/15 0355  WBC 5.1 7.4  RBC 3.43* 3.35*  HCT 32.0* 31.3*  PLT 245 223    Recent Labs  07/24/15 1829 07/25/15 0355  NA 133* 137  K 4.9 5.0  CL 102 105  CO2 24 27  BUN 19 18  CREATININE 1.37* 1.16*  GLUCOSE 129* 104*  CALCIUM 9.3 8.5*   No results for input(s): LABPT, INR in the last 72 hours.  EXAM General - Patient is Alert, Appropriate and Oriented Extremity - Neurologically intact Neurovascular intact Sensation intact distally Intact pulses distally Dorsiflexion/Plantar flexion intact  - homans sign BLE Dressing - dressing C/D/I, scant drainage and dressing changed this am Motor Function - intact, moving foot and toes well on exam.   Past Medical History  Diagnosis Date  . Complication of anesthesia     pt heard the drill during surgery.  . Coronary artery disease   . Hypertension   . COPD (chronic obstructive pulmonary disease) ([place])   . Depression   . Chronic kidney disease 2015    stage 3  . GERD (gastroesophageal reflux disease)   . Arthritis     Assessment/Plan:   2 Days Post-Op Procedure(s)  (LRB): TOTAL HIP ARTHROPLASTY ANTERIOR APPROACH (Right) Active Problems:   Primary osteoarthritis of right hip   Acute post op blood loss anemia    Estimated body mass index is 26.74 kg/(m^2) as calculated from the following:   Height as of this encounter: 5' (1.524 m).   Weight as of this encounter: 62.097 kg (136 lb 14.4 oz). Advance diet Up with therapy  Recheck labs in the am Appreciate medicine consult  Plan on discharge to home with HHPT Sunday  DVT Prophylaxis - Lovenox, Foot Pumps and TED hose Weight-Bearing as tolerated to right leg D/C O2 and Pulse OX and try on Room Air  T. Rachelle Hora, PA-C [place] 07/25/2015, 6:23 AM

## 2015-07-25 NOTE — Progress Notes (Signed)
[place] at [place] NAME: Kathleen Cox    MR#:  [id number]  DATE OF BIRTH:  [birth year]  SUBJECTIVE:   Patient is doing well this morning. She has no complaints.  REVIEW OF SYSTEMS:    Review of Systems  Constitutional: Negative for fever, chills and malaise/fatigue.  HENT: Negative for sore throat.   Eyes: Negative for blurred vision.  Respiratory: Negative for cough, hemoptysis, shortness of breath and wheezing.   Cardiovascular: Negative for chest pain, palpitations and leg swelling.  Gastrointestinal: Negative for nausea, vomiting, abdominal pain, diarrhea and blood in stool.  Genitourinary: Negative for dysuria.  Musculoskeletal: Negative for back pain.  Neurological: Negative for dizziness, tremors and headaches.  Endo/Heme/Allergies: Does not bruise/bleed easily.    Tolerating Diet: Yes      DRUG ALLERGIES:   Allergies  Allergen Reactions  . Amoxicillin Diarrhea  . Levofloxacin Other (See Comments)    Muscle pain.  Marland Kitchen Percocet [Oxycodone-Acetaminophen] Other (See Comments)    hallucinate  . Neosporin [Neomycin-Bacitracin Zn-Polymyx] Rash    VITALS:  Blood pressure 141/40, pulse 64, temperature 99 F (37.2 C), temperature source Oral, resp. rate 18, height 5' (1.524 m), weight 62.097 kg (136 lb 14.4 oz), SpO2 96 %.  PHYSICAL EXAMINATION:   Physical Exam  Constitutional: She is oriented to person, place, and time and well-developed, well-nourished, and in no distress. No distress.  HENT:  Head: Normocephalic.  Eyes: No scleral icterus.  Neck: Normal range of motion. Neck supple. No JVD present. No tracheal deviation present.  Cardiovascular: Normal rate, regular rhythm and normal heart sounds.  Exam reveals no gallop and no friction rub.   No murmur heard. Pulmonary/Chest: Effort normal and breath sounds normal. No respiratory distress. She has no wheezes. She has no rales. She exhibits no tenderness.   Abdominal: Soft. Bowel sounds are normal. She exhibits no distension and no mass. There is no tenderness. There is no rebound and no guarding.  Musculoskeletal: Normal range of motion. She exhibits no edema.  Neurological: She is alert and oriented to person, place, and time.  Skin: Skin is warm. No rash noted. No erythema.  Psychiatric: Affect and judgment normal.      LABORATORY PANEL:   CBC  Recent Labs Lab 07/25/15 0355  WBC 7.4  HGB 10.6*  HCT 31.3*  PLT 223   ------------------------------------------------------------------------------------------------------------------  Chemistries   Recent Labs Lab 07/25/15 0355  NA 137  K 5.0  CL 105  CO2 27  GLUCOSE 104*  BUN 18  CREATININE 1.16*  CALCIUM 8.5*   ------------------------------------------------------------------------------------------------------------------  Cardiac Enzymes No results for input(s): TROPONINI in the last 168 hours. ------------------------------------------------------------------------------------------------------------------  RADIOLOGY:  Dg Hip Unilat W Or W/o Pelvis 2-3 Views Right  07/23/2015  CLINICAL DATA:  Postop right hip EXAM: DG HIP (WITH OR WITHOUT PELVIS) 2-3V RIGHT COMPARISON:  None. FINDINGS: 2 portable views of the right hip submitted. There is right hip prosthesis with anatomic alignment. Postsurgical changes are noted with lateral skin staples. Small amount of periarticular soft tissue air. IMPRESSION: Right hip prosthesis with anatomic alignment. Postsurgical changes are noted. Electronically Signed   By: Lahoma Crocker M.D.   On: 07/23/2015 09:56     ASSESSMENT AND PLAN:   74 year old female with a history of essential hypertension, chronic kidney disease stage III and CAD who is status post total hip arthroplasty and was found to have hyperkalemia.  1. Hyperkalemia: I suspect this could be related to losartan which  she was taking at home for her blood pressure. This  medication has been stopped and should be discontinued at discharge until she can follow up with her primary care physician. Potassium has improved. She had no EKG changes suggestive of hyperkalemia.  2. Essential hypertension: Patient will continue diltiazem and in the place of losartan I have added hydralazine 25 mg by mouth twice a day which the patient should be discharged on.  3. CAD: Continue diltiazem and simvastatin and aspirin.  4. Chronic kidney disease stage III: Creatinine is stable.  5. Anemia of chronic disease: Hemoglobin is stable.    I will sign off for now. Please call me if you have further questions. Thank you for allowing me to participate in the care of your patient.    Management plans discussed with the patient and she is in agreement.  CODE STATUS: Full  TOTAL TIME TAKING CARE OF THIS PATIENT: 25 minutes.     POSSIBLE D/C tomorrow, DEPENDING ON CLINICAL CONDITION.   Opal Dinning M.D on 07/25/2015 at 9:14 AM  Between 7am to 6pm - Pager - [phone] After 6pm go to [email or web address] - password [email or web address] [place]  Office  [phone]  CC: Primary care physician; Kirk Ruths., MD  Note: This dictation was prepared with Dragon dictation along with smaller phrase technology. Any transcriptional errors that result from this process are unintentional.

## 2015-07-25 NOTE — Progress Notes (Signed)
SATURATION QUALIFICATIONS: (This note is used to comply with regulatory documentation for home oxygen)  Patient Saturations on Room Air at Rest = 83%  Patient Saturations on 2 Liters of oxygen at rest  = 92%   

## 2015-07-25 NOTE — Progress Notes (Signed)
Physical Therapy Treatment Patient Details Name: Kathleen Cox MRN: [id number] DOB: 1941/01/20 Today's Date: 07/25/2015    History of Present Illness Pt admitted for R THR on 07/23/15.    PT Comments    Patient significantly improves ambulation distance and independence with transfers during this session. Patient continues to demonstrate decreased bed mobility secondary to RLE weakness/pain. Patient continues to have shortness of breath and cough with ambulation on 2L, though with pacing she is able to complete limited home mobility. Patient appears anxious throughout this session, and ends session secondary to cough. RN notified and aware. Skilled PT services continue to be indicated to address the above mobility deficits.   Follow Up Recommendations  Home health PT;Supervision/Assistance - 24 hour     Equipment Recommendations       Recommendations for Other Services       Precautions / Restrictions Precautions Precautions: Anterior Hip Precaution Booklet Issued: No Restrictions Weight Bearing Restrictions: Yes RLE Weight Bearing: Weight bearing as tolerated    Mobility  Bed Mobility Overal bed mobility: Needs Assistance Bed Mobility: Supine to Sit;Sit to Supine     Supine to sit: Min assist Sit to supine: Min assist;Mod assist   General bed mobility comments: Patient requires assistance to transfer RLE in and out of bed secondary to decreased strength and pain, no other assistance required.   Transfers Overall transfer level: Modified independent Equipment used: Rolling walker (2 wheeled) Transfers: Sit to/from Stand Sit to Stand: Modified independent (Device/Increase time)         General transfer comment: Patient demonstrates no loss of balance, appropriate timing and sequencing for transfer.   Ambulation/Gait Ambulation/Gait assistance: Modified independent (Device/Increase time) Ambulation Distance (Feet): 80 Feet Assistive device: Rolling walker (2  wheeled) Gait Pattern/deviations: WFL(Within Functional Limits)   Gait velocity interpretation: Below normal speed for age/gender General Gait Details: Patient is able to ambulate with mildly decreased gait speed, no loss of balance. She develops cough and increased shortness of breath with with ambulation with 2L.    Stairs            Wheelchair Mobility    Modified Rankin (Stroke Patients Only)       Balance Overall balance assessment: Modified Independent   Sitting balance-Leahy Scale: Good     Standing balance support: Bilateral upper extremity supported Standing balance-Leahy Scale: Good                      Cognition Arousal/Alertness: Awake/alert Behavior During Therapy: WFL for tasks assessed/performed;Anxious Overall Cognitive Status: Within Functional Limits for tasks assessed                      Exercises Total Joint Exercises Gluteal Sets: AROM;Both;15 reps Heel Slides: AROM;Both;10 reps Hip ABduction/ADduction: AROM;Both;10 reps Straight Leg Raises: AROM;Both;10 reps Long Arc Quad: AROM;Both;10 reps    General Comments General comments (skin integrity, edema, etc.): Swelling noted around L hand, she notes IV was just moved from L hand to R hand.       Pertinent Vitals/Pain Pain Assessment:  (Pt reports no spasms today but "my hip hurts" referencing R hip, she does not provide 0-10. RN aware.) Pain Location: R hip Pain Descriptors / Indicators: Aching Pain Intervention(s): Limited activity within patient's tolerance;Monitored during session;Premedicated before session    Home Living                      Prior Function  PT Goals (current goals can now be found in the care plan section) Acute Rehab PT Goals Patient Stated Goal: to go home PT Goal Formulation: With patient Time For Goal Achievement: 08/06/15 Potential to Achieve Goals: Good Additional Goals Additional Goal #1: Pt will be able to perform  bed mobility/transfers with rw and supervision in order to improve functional mobility Progress towards PT goals: Progressing toward goals    Frequency  BID    PT Plan Current plan remains appropriate    Co-evaluation             End of Session Equipment Utilized During Treatment: Gait belt Activity Tolerance: Patient tolerated treatment well (Limited by anxiety regarding respiration/O2 sats) Patient left: in bed;with bed alarm set;with call bell/phone within reach     Time: 1034-1100 PT Time Calculation (min) (ACUTE ONLY): 26 min  Charges:  $Gait Training: 8-22 mins $Therapeutic Exercise: 8-22 mins                    G Codes:      Kerman Passey, PT, DPT    07/25/2015, 11:13 AM

## 2015-07-26 ENCOUNTER — Inpatient Hospital Stay: Payer: Medicare Other

## 2015-07-26 ENCOUNTER — Inpatient Hospital Stay
Admission: RE | Admit: 2015-07-26 | Discharge: 2015-07-26 | Disposition: A | Discharge status: Home / Self Care | Payer: Medicare Other | Source: Ambulatory Visit | Attending: Internal Medicine | Admitting: Internal Medicine

## 2015-07-26 LAB — CBC
HCT: 29.1 % — ABNORMAL LOW (ref 35.0–47.0)
Hemoglobin: 9.8 g/dL — ABNORMAL LOW (ref 12.0–16.0)
MCH: 31.9 pg (ref 26.0–34.0)
MCHC: 33.7 g/dL (ref 32.0–36.0)
MCV: 94.5 fL (ref 80.0–100.0)
PLATELETS: 197 10*3/uL (ref 150–440)
RBC: 3.08 MIL/uL — ABNORMAL LOW (ref 3.80–5.20)
RDW: 16.2 % — AB (ref 11.5–14.5)
WBC: 6.7 10*3/uL (ref 3.6–11.0)

## 2015-07-26 LAB — BASIC METABOLIC PANEL
Anion gap: 4 — ABNORMAL LOW (ref 5–15)
BUN: 15 mg/dL (ref 6–20)
CO2: 27 mmol/L (ref 22–32)
CREATININE: 1.05 mg/dL — AB (ref 0.44–1.00)
Calcium: 8.7 mg/dL — ABNORMAL LOW (ref 8.9–10.3)
Chloride: 105 mmol/L (ref 101–111)
GFR calc Af Amer: 59 mL/min — ABNORMAL LOW (ref >60)
GFR, EST NON AFRICAN AMERICAN: 51 mL/min — AB (ref >60)
GLUCOSE: 85 mg/dL (ref 65–99)
Potassium: 4.6 mmol/L (ref 3.5–5.1)
SODIUM: 136 mmol/L (ref 135–145)

## 2015-07-26 MED ORDER — CETYLPYRIDINIUM CHLORIDE 0.05 % MT LIQD
7.0000 mL | Freq: Two times a day (BID) | OROMUCOSAL | Status: DC
  Administered 2015-07-26 – 2015-07-28 (×4): 7 mL via OROMUCOSAL

## 2015-07-26 MED ORDER — AZITHROMYCIN 250 MG PO TABS
500.0000 mg | ORAL_TABLET | Freq: Once | ORAL | Status: AC
  Administered 2015-07-26: 500 mg via ORAL
  Filled 2015-07-26: qty 2

## 2015-07-26 MED ORDER — ASPIRIN 81 MG PO CHEW
81.0000 mg | CHEWABLE_TABLET | Freq: Every day | ORAL | Status: DC
  Administered 2015-07-26 – 2015-07-28 (×3): 81 mg via ORAL
  Filled 2015-07-26 (×3): qty 1

## 2015-07-26 NOTE — Progress Notes (Signed)
Dr Benjie Karvonen notified of chest xray results, Dr Benjie Karvonen placed orders.

## 2015-07-26 NOTE — Progress Notes (Signed)
SATURATION QUALIFICATIONS: (This note is used to comply with regulatory documentation for home oxygen)  Patient Saturations on Room Air at Rest = 87%    Patient Saturations on 2 Liters of oxygen at rest = 92%

## 2015-07-26 NOTE — Progress Notes (Signed)
OT Cancellation Note  Patient Details Name: Kathleen Cox MRN: [id number] DOB: October 09, 1940   Cancelled Treatment:    Reason Eval/Treat Not Completed: Patient declined, no reason specified Pt up in chair and did well with PT but declined OT today indicating she knew all about adaptive equipment for ADLs and did not need to work on ADLs.  She is hoping to go home today but now has pneumonia and fluctuating HR.  Will continue with OT tomorrow.  [place], OTR/L ascom [phone] 07/26/2015, 12:44 PM

## 2015-07-26 NOTE — Progress Notes (Signed)
Dr Benjie Karvonen requested to be paged when chest xray results. Dr Benjie Karvonen paged, waiting on callback.

## 2015-07-26 NOTE — Progress Notes (Signed)
Kathleen Cox at [place] NAME: Kathleen Cox    MR#:  [id number]  DATE OF BIRTH:  11/09/1940  SUBJECTIVE:   There was some concern that the patient had bradycardia yesterday she was then placed on telemetry. Telemetry shows no bradycardia this morning. She is doing well. She does report that prior to her operation she was coming down with a cough.Marland Kitchen  REVIEW OF SYSTEMS:    Review of Systems  Constitutional: Negative for fever, chills and malaise/fatigue.  HENT: Negative for sore throat.   Eyes: Negative for blurred vision.  Respiratory: Positive for cough. Negative for hemoptysis, shortness of breath and wheezing.   Cardiovascular: Negative for chest pain, palpitations and leg swelling.  Gastrointestinal: Negative for nausea, vomiting, abdominal pain, diarrhea and blood in stool.  Genitourinary: Negative for dysuria.  Musculoskeletal: Negative for back pain.  Neurological: Negative for dizziness, tremors and headaches.  Endo/Heme/Allergies: Does not bruise/bleed easily.    Tolerating Diet: Yes      DRUG ALLERGIES:   Allergies  Allergen Reactions  . Amoxicillin Diarrhea  . Levofloxacin Other (See Comments)    Muscle pain.  Marland Kitchen Percocet [Oxycodone-Acetaminophen] Other (See Comments)    hallucinate  . Neosporin [Neomycin-Bacitracin Zn-Polymyx] Rash    VITALS:  Blood pressure 140/52, pulse 69, temperature 98.3 F (36.8 C), temperature source Oral, resp. rate 17, height 5' (1.524 m), weight 62.097 kg (136 lb 14.4 oz), SpO2 92 %.  PHYSICAL EXAMINATION:   Physical Exam  Constitutional: She is oriented to person, place, and time and well-developed, well-nourished, and in no distress. No distress.  HENT:  Head: Normocephalic.  Eyes: No scleral icterus.  Neck: Normal range of motion. Neck supple. No JVD present. No tracheal deviation present.  Cardiovascular: Normal rate, regular rhythm and normal heart sounds.  Exam reveals no  gallop and no friction rub.   No murmur heard. Pulmonary/Chest: Effort normal and breath sounds normal. No respiratory distress. She has no wheezes. She has no rales. She exhibits no tenderness.  Abdominal: Soft. Bowel sounds are normal. She exhibits no distension and no mass. There is no tenderness. There is no rebound and no guarding.  Musculoskeletal: Normal range of motion. She exhibits no edema.  Neurological: She is alert and oriented to person, place, and time.  Skin: Skin is warm. No rash noted. No erythema.  Psychiatric: Affect and judgment normal.      LABORATORY PANEL:   CBC  Recent Labs Lab 07/26/15 0334  WBC 6.7  HGB 9.8*  HCT 29.1*  PLT 197   ------------------------------------------------------------------------------------------------------------------  Chemistries   Recent Labs Lab 07/26/15 0334  NA 136  K 4.6  CL 105  CO2 27  GLUCOSE 85  BUN 15  CREATININE 1.05*  CALCIUM 8.7*   ------------------------------------------------------------------------------------------------------------------  Cardiac Enzymes No results for input(s): TROPONINI in the last 168 hours. ------------------------------------------------------------------------------------------------------------------  RADIOLOGY:  Dg Chest Port 1 View  07/26/2015  CLINICAL DATA:  Decreased oxygen saturation levels EXAM: PORTABLE CHEST 1 VIEW COMPARISON:  October 18, 2008 FINDINGS: There is left base consolidation with small left effusion. Lungs elsewhere clear. Heart is enlarged with pulmonary vascularity within normal limits. No adenopathy. There is extensive atherosclerotic calcification in aorta. IMPRESSION: Medial opacity left base consistent with focal airspace consolidation with small effusion. Lungs elsewhere clear. Stable cardiomegaly. Electronically Signed   By: Lowella Grip III M.D.   On: 07/26/2015 08:13     ASSESSMENT AND PLAN:   74 year old female with a history of  essential hypertension, chronic kidney disease stage III and CAD who is status post total hip arthroplasty and was found to have hyperkalemia.  1. Hyperkalemia: I suspect this could be related to losartan which she was taking at home for her blood pressure. This medication has been stopped and should be discontinued at discharge until she can follow up with her primary care physician. Potassium has improved. She had no EKG changes suggestive of hyperkalemia.  2. Essential hypertension: Patient will continue diltiazem and in the place of losartan I have added hydralazine 25 mg by mouth twice a day which the patient should be discharged on.  3. CAD: Continue diltiazem and simvastatin and aspirin.  4. Chronic kidney disease stage III: Creatinine is stable.  5. Anemia of chronic disease: Hemoglobin is stable.  6. Hypoxic respiratory failure: This is secondary to pneumonia. I suspect the patient had pneumonia prior to coming to the hospital as she reports. Patient is allergic to Levaquin and therefore have started azithromycin 500 mg daily. She is also allergic to amoxicillin. She should be treated for 5-7 days. Oxygen will be titrated as tolerated.       Management plans discussed with the patient and she is in agreement.  CODE STATUS: Full  TOTAL TIME TAKING CARE OF THIS PATIENT: 25 minutes.     POSSIBLE D/C tomorrow, DEPENDING ON CLINICAL CONDITION.   Kathleen Cox M.D on 07/26/2015 at 9:58 AM  Between 7am to 6pm - Pager - [phone] After 6pm go to [email or web address] - password [email or web address] [place] [facility] [facility]  Office  [phone]  CC: Primary care physician; Kirk Ruths., MD  Note: This dictation was prepared with Dragon dictation along with smaller phrase technology. Any transcriptional errors that result from this process are unintentional.

## 2015-07-26 NOTE — Progress Notes (Signed)
Physical Therapy Treatment Patient Details Name: Kathleen Cox MRN: [id number] DOB: 06/09/1941 Today's Date: 07/26/2015    History of Present Illness Pt admitted for R THR on 07/23/15.    PT Comments    Patient on telemetry and 2L O2 for HR monitoring and better SPO2 levels. Patient's HR varied from low 60's to 120s. RN came and checked HR multiple times manually and found that HR was different than telemetry monitor. She reports that the patient's HR is WNL for therapy. Patient reports increased shortness of breath with gait tasks. She also reported increased right hip soreness with supine exercise. Able to progress LE strengthening with increased repetitions and ROM. Patient would benefit from additional skilled PT intervention to improve strength, balance and functional mobility.   Follow Up Recommendations  Home health PT;Supervision/Assistance - 24 hour     Equipment Recommendations       Recommendations for Other Services       Precautions / Restrictions Precautions Precautions: Anterior Hip Precaution Booklet Issued: No Restrictions Weight Bearing Restrictions: Yes RLE Weight Bearing: Weight bearing as tolerated    Mobility  Bed Mobility Overal bed mobility: Needs Assistance Bed Mobility: Supine to Sit     Supine to sit: Min assist     General bed mobility comments: Patient requires min A to initiate RLE hip adduction for getting into sitting position; x1 rep; Patient required min VCs for hand placement and to roll towards left side for better transition  Transfers Overall transfer level: Modified independent Equipment used: Rolling walker (2 wheeled) Transfers: Sit to/from Stand Sit to Stand: Modified independent (Device/Increase time)         General transfer comment: Transfers from bed sit<>Stand with RW, good hand placement and positioning; Stand to sit in bedside chair with RW, with good hand placement and safety;   Ambulation/Gait Ambulation/Gait  assistance: Min guard Ambulation Distance (Feet): 35 Feet Assistive device: Rolling walker (2 wheeled) Gait Pattern/deviations: Step-through pattern;Decreased stride length;Decreased weight shift to right Gait velocity: decreased   General Gait Details: patient ambulates with slower gait speed, good posture; She did require min Vcs to stay close to RW with turning. Patient reports increased shortness of breath which limited distance; On 2L O2   Stairs            Wheelchair Mobility    Modified Rankin (Stroke Patients Only)       Balance                                    Cognition Arousal/Alertness: Awake/alert Behavior During Therapy: WFL for tasks assessed/performed;Anxious Overall Cognitive Status: Within Functional Limits for tasks assessed                      Exercises Total Joint Exercises Ankle Circles/Pumps: AROM;Both;15 reps;Supine Short Arc Quad: AROM;Both;15 reps;Supine Heel Slides: AROM;Both;15 reps;Supine Hip ABduction/ADduction: AROM;AAROM;Right;Left;15 reps;Supine (AAROM on RLE) Straight Leg Raises: AROM;AAROM;Right;Left;10 reps;Supine (AAROM on RLE for increased ROM) Other Exercises Other Exercises: Patient required min VCS for increased ROM to increase strengthening and flexibility; She had increased difficulty with RLE movement due to pain and weakness.     General Comments        Pertinent Vitals/Pain Pain Assessment: 0-10 Pain Score: 6  Pain Location: right hip/groin Pain Descriptors / Indicators: Aching;Sore Pain Intervention(s): Limited activity within patient's tolerance;Monitored during session;Repositioned;Patient requesting pain meds-RN notified    Home Living  Prior Function            PT Goals (current goals can now be found in the care plan section) Acute Rehab PT Goals Patient Stated Goal: to go home PT Goal Formulation: With patient Time For Goal Achievement:  08/06/15 Potential to Achieve Goals: Good Additional Goals Additional Goal #1: Pt will be able to perform bed mobility/transfers with rw and supervision in order to improve functional mobility    Frequency  BID    PT Plan Current plan remains appropriate    Co-evaluation             End of Session Equipment Utilized During Treatment: Gait belt Activity Tolerance: Patient tolerated treatment well (Limited by anxiety regarding respiration/O2 sats) Patient left: with call bell/phone within reach;in chair;with chair alarm set     Time: 1115-1145 PT Time Calculation (min) (ACUTE ONLY): 30 min  Charges:  $Gait Training: 8-22 mins $Therapeutic Exercise: 8-22 mins                    G Codes:      Jerie Basford PT, DPT 07/26/2015, 12:55 PM

## 2015-07-26 NOTE — Progress Notes (Signed)
   Subjective: 3 Days Post-Op Procedure(s) (LRB): TOTAL HIP ARTHROPLASTY ANTERIOR APPROACH (Right) Patient reports pain as 2 on 0-10 scale.   Patient is well, and has had no acute complaints or problems  No CP, ABD pain.  We will continue therapy today.  02 sats dropping yesterday. Plan is to go Rehab after hospital stay.  Objective: Vital signs in last 24 hours: Temp:  [98 F (36.7 C)-98.4 F (36.9 C)] 98.3 F (36.8 C) (12/11 0502) Pulse Rate:  [66-69] 69 (12/11 0704) Resp:  [17-18] 17 (12/11 0704) BP: (124-140)/(47-57) 140/52 mmHg (12/11 0704) SpO2:  [87 %-100 %] 92 % (12/11 0749)  Intake/Output from previous day: 12/10 0701 - 12/11 0700 In: 120 [P.O.:120] Out: 100 [Urine:100] Intake/Output this shift:     Recent Labs  07/23/15 0943 07/25/15 0355 07/26/15 0334  HGB 10.4* 10.6* 9.8*    Recent Labs  07/25/15 0355 07/26/15 0334  WBC 7.4 6.7  RBC 3.35* 3.08*  HCT 31.3* 29.1*  PLT 223 197    Recent Labs  07/25/15 0355 07/26/15 0334  NA 137 136  K 5.0 4.6  CL 105 105  CO2 27 27  BUN 18 15  CREATININE 1.16* 1.05*  GLUCOSE 104* 85  CALCIUM 8.5* 8.7*   No results for input(s): LABPT, INR in the last 72 hours.  EXAM General - Patient is Alert, Appropriate and Oriented Extremity - Neurologically intact Neurovascular intact Sensation intact distally Intact pulses distally Dorsiflexion/Plantar flexion intact  - homans sign BLE Pulmonary: no respiratory distress. Wheezing to auscultation Dressing - dressing C/D/I and scant drainage Motor Function - intact, moving foot and toes well on exam.   Past Medical History  Diagnosis Date  . Complication of anesthesia     pt heard the drill during surgery.  . Coronary artery disease   . Hypertension   . COPD (chronic obstructive pulmonary disease) ([place])   . Depression   . Chronic kidney disease 2015    stage 3  . GERD (gastroesophageal reflux disease)   . Arthritis     Assessment/Plan:   3 Days  Post-Op Procedure(s) (LRB): TOTAL HIP ARTHROPLASTY ANTERIOR APPROACH (Right) Active Problems:   Primary osteoarthritis of right hip   Acute post op blood loss anemia    hypoxic   Estimated body mass index is 26.74 kg/(m^2) as calculated from the following:   Height as of this encounter: 5' (1.524 m).   Weight as of this encounter: 62.097 kg (136 lb 14.4 oz). Advance diet Up with therapy  Appreciate medicine consult, CXR pending Plan on discharge to home with HHPT today or Monday  DVT Prophylaxis - Lovenox, Foot Pumps and TED hose Weight-Bearing as tolerated to right leg D/C O2 and Pulse OX and try on Room Air  T. Rachelle Hora, PA-C [place] 07/26/2015, 8:38 AM

## 2015-07-27 ENCOUNTER — Inpatient Hospital Stay: Payer: Medicare Other

## 2015-07-27 LAB — BASIC METABOLIC PANEL
Anion gap: 5 (ref 5–15)
BUN: 19 mg/dL (ref 6–20)
CHLORIDE: 103 mmol/L (ref 101–111)
CO2: 27 mmol/L (ref 22–32)
CREATININE: 1.23 mg/dL — AB (ref 0.44–1.00)
Calcium: 9.3 mg/dL (ref 8.9–10.3)
GFR, EST AFRICAN AMERICAN: 48 mL/min — AB (ref >60)
GFR, EST NON AFRICAN AMERICAN: 42 mL/min — AB (ref >60)
Glucose, Bld: 207 mg/dL — ABNORMAL HIGH (ref 65–99)
POTASSIUM: 4.8 mmol/L (ref 3.5–5.1)
SODIUM: 135 mmol/L (ref 135–145)

## 2015-07-27 LAB — CBC
HCT: 29.3 % — ABNORMAL LOW (ref 35.0–47.0)
Hemoglobin: 9.7 g/dL — ABNORMAL LOW (ref 12.0–16.0)
MCH: 31.3 pg (ref 26.0–34.0)
MCHC: 33 g/dL (ref 32.0–36.0)
MCV: 94.7 fL (ref 80.0–100.0)
PLATELETS: 189 10*3/uL (ref 150–440)
RBC: 3.09 MIL/uL — ABNORMAL LOW (ref 3.80–5.20)
RDW: 15.7 % — AB (ref 11.5–14.5)
WBC: 6 10*3/uL (ref 3.6–11.0)

## 2015-07-27 LAB — MAGNESIUM: MAGNESIUM: 2 mg/dL (ref 1.7–2.4)

## 2015-07-27 LAB — SURGICAL PATHOLOGY

## 2015-07-27 MED ORDER — DEXTROSE 5 % IV SOLN
1.0000 g | INTRAVENOUS | Status: DC
  Administered 2015-07-27 – 2015-07-28 (×2): 1 g via INTRAVENOUS
  Filled 2015-07-27 (×2): qty 10

## 2015-07-27 MED ORDER — AZITHROMYCIN 250 MG PO TABS: 500.0000 mg | ORAL_TABLET | Freq: Every day | ORAL | Status: AC

## 2015-07-27 MED ORDER — METHYLPREDNISOLONE SODIUM SUCC 40 MG IJ SOLR
40.0000 mg | Freq: Three times a day (TID) | INTRAMUSCULAR | Status: DC
  Administered 2015-07-27 – 2015-07-28 (×4): 40 mg via INTRAVENOUS
  Filled 2015-07-27 (×4): qty 1

## 2015-07-27 MED ORDER — PREDNISONE 10 MG PO TABS: ORAL_TABLET | ORAL | Status: AC

## 2015-07-27 NOTE — Care Management Note (Addendum)
Case Management Note  Patient Details  Name: Kathleen Cox MRN: [id number] Date of Birth: 1940-11-24  Subjective/Objective:    Case discussed with Jefm Bryant PA. Discharge planned for today. Requested order for home O2 and home health SN and PT.  Ordered home O2 from Will with  [place]. Spoke with patient and she prefers [place]. Referral called to [employer]. She is agreeable to POC. Per previous CM note, patient has all needed equipment and support.  No additional needs identified.               Action/Plan: Home with SN and PT through [facility]  Expected Discharge Date:                  Expected Discharge Plan:  Vamo  In-House Referral:     Discharge planning Services  CM Consult  Post Acute Care Choice:  Home Health, Durable Medical Equipment Choice offered to:  Patient, Spouse  DME Arranged:  Oxygen DME Agency:  [place]:  RN, PT [facility] Agency:  [place] [facility]  Status of Service:  Completed, signed off  Medicare Important Message Given:  Yes Date Medicare IM Given:    Medicare IM give by:    Date Additional Medicare IM Given:    Additional Medicare Important Message give by:     If discussed at [place] of Stay Meetings, dates discussed:    Additional Comments:  Jolly Mango, RN 07/27/2015, 10:08 AM

## 2015-07-27 NOTE — Care Management Important Message (Signed)
Important Message  Patient Details  Name: ILEYAH NAPOLITAN MRN: [id number] Date of Birth: 20-Mar-1941   Medicare Important Message Given:  Yes    Kip Cropp A, RN 07/27/2015, 8:36 AM

## 2015-07-27 NOTE — Progress Notes (Signed)
rn received called from  [facility]u that pt with frequent pacs,bigeminy , sustained rate 72 . ccu to send strip

## 2015-07-27 NOTE — Progress Notes (Signed)
   Subjective: 4 Days Post-Op Procedure(s) (LRB): TOTAL HIP ARTHROPLASTY ANTERIOR APPROACH (Right) Patient reports pain as 2 on 0-10 scale.   Patient is well, and has had no acute complaints or problems  No CP, SOB, ABD pain. We will continue therapy today.  Plan is to go Rehab after hospital stay. Recent CXR showed lower lobe PNA, patient started on ABX and steroids.  Objective: Vital signs in last 24 hours: Temp:  [97.6 F (36.4 C)-99.5 F (37.5 C)] 98.4 F (36.9 C) (12/12 0732) Pulse Rate:  [36-65] 65 (12/12 0732) Resp:  [18] 18 (12/12 0732) BP: (96-137)/(37-47) 128/45 mmHg (12/12 0732) SpO2:  [94 %-98 %] 94 % (12/12 0732)  Intake/Output from previous day: 12/11 0701 - 12/12 0700 In: 120 [P.O.:120] Out: 0  Intake/Output this shift: Total I/O In: 240 [P.O.:240] Out: 0    Recent Labs  07/25/15 0355 07/26/15 0334 07/27/15 0350  HGB 10.6* 9.8* 9.7*    Recent Labs  07/26/15 0334 07/27/15 0350  WBC 6.7 6.0  RBC 3.08* 3.09*  HCT 29.1* 29.3*  PLT 197 189    Recent Labs  07/25/15 0355 07/26/15 0334  NA 137 136  K 5.0 4.6  CL 105 105  CO2 27 27  BUN 18 15  CREATININE 1.16* 1.05*  GLUCOSE 104* 85  CALCIUM 8.5* 8.7*   No results for input(s): LABPT, INR in the last 72 hours.  EXAM General - Patient is Alert, Appropriate and Oriented Extremity - Neurologically intact Neurovascular intact Sensation intact distally Intact pulses distally Dorsiflexion/Plantar flexion intact  - homans sign BLE Dressing - dressing C/D/I and scant drainage Motor Function - intact, moving foot and toes well on exam.   Past Medical History  Diagnosis Date  . Complication of anesthesia     pt heard the drill during surgery.  . Coronary artery disease   . Hypertension   . COPD (chronic obstructive pulmonary disease) ([place])   . Depression   . Chronic kidney disease 2015    stage 3  . GERD (gastroesophageal reflux disease)   . Arthritis     Assessment/Plan:   4 Days  Post-Op Procedure(s) (LRB): TOTAL HIP ARTHROPLASTY ANTERIOR APPROACH (Right) Active Problems:   Primary osteoarthritis of right hip   Acute post op blood loss anemia    Estimated body mass index is 26.74 kg/(m^2) as calculated from the following:   Height as of this encounter: 5' (1.524 m).   Weight as of this encounter: 62.097 kg (136 lb 14.4 oz). Advance diet Up with therapy  Discharge to home with home health today Follow up with [place] in 2 weeks for staple removal Will Treat PNA outpatient with Azithromycin and prednisone  DVT Prophylaxis - Lovenox, Foot Pumps and TED hose Weight-Bearing as tolerated to right leg D/C O2 and Pulse OX and try on Room Air  T. Rachelle Hora, PA-C [place] 07/27/2015, 9:20 AM

## 2015-07-27 NOTE — Progress Notes (Signed)
[place] at [place] NAME: Kathleen Cox    MR#:  [id number]  DATE OF BIRTH:  1940-10-13  SUBJECTIVE:   Patient still with cough feeling frustrated about her cough and was SOB after walking with PT  REVIEW OF SYSTEMS:    Review of Systems  Constitutional: Negative for fever, chills and malaise/fatigue.  HENT: Negative for sore throat.   Eyes: Negative for blurred vision.  Respiratory: Positive for cough. Negative for hemoptysis, shortness of breath and wheezing.   Cardiovascular: Negative for chest pain, palpitations and leg swelling.  Gastrointestinal: Negative for nausea, vomiting, abdominal pain, diarrhea and blood in stool.  Genitourinary: Negative for dysuria.  Musculoskeletal: Negative for back pain.  Neurological: Negative for dizziness, tremors and headaches.  Endo/Heme/Allergies: Does not bruise/bleed easily.    Tolerating Diet: Yes      DRUG ALLERGIES:   Allergies  Allergen Reactions  . Amoxicillin Diarrhea  . Levofloxacin Other (See Comments)    Muscle pain.  Marland Kitchen Percocet [Oxycodone-Acetaminophen] Other (See Comments)    hallucinate  . Neosporin [Neomycin-Bacitracin Zn-Polymyx] Rash    VITALS:  Blood pressure 128/45, pulse 65, temperature 98.4 F (36.9 C), temperature source Oral, resp. rate 18, height 5' (1.524 m), weight 62.097 kg (136 lb 14.4 oz), SpO2 94 %.  PHYSICAL EXAMINATION:   Physical Exam  Constitutional: She is oriented to person, place, and time and well-developed, well-nourished, and in no distress. No distress.  HENT:  Head: Normocephalic.  Eyes: No scleral icterus.  Neck: Normal range of motion. Neck supple. No JVD present. No tracheal deviation present.  Cardiovascular: Normal rate, regular rhythm and normal heart sounds.  Exam reveals no gallop and no friction rub.   No murmur heard. Pulmonary/Chest: Effort normal and breath sounds normal. No respiratory distress. She has no  wheezes. She has no rales. She exhibits no tenderness.  Abdominal: Soft. Bowel sounds are normal. She exhibits no distension and no mass. There is no tenderness. There is no rebound and no guarding.  Musculoskeletal: Normal range of motion. She exhibits no edema.  Neurological: She is alert and oriented to person, place, and time.  Skin: Skin is warm. No rash noted. No erythema.  Psychiatric: Affect and judgment normal.      LABORATORY PANEL:   CBC  Recent Labs Lab 07/27/15 0350  WBC 6.0  HGB 9.7*  HCT 29.3*  PLT 189   ------------------------------------------------------------------------------------------------------------------  Chemistries   Recent Labs Lab 07/26/15 0334  NA 136  K 4.6  CL 105  CO2 27  GLUCOSE 85  BUN 15  CREATININE 1.05*  CALCIUM 8.7*   ------------------------------------------------------------------------------------------------------------------  Cardiac Enzymes No results for input(s): TROPONINI in the last 168 hours. ------------------------------------------------------------------------------------------------------------------  RADIOLOGY:  Dg Chest 1 View  07/27/2015  CLINICAL DATA:  Pneumonia EXAM: CHEST 1 VIEW COMPARISON:  07/26/2015 FINDINGS: Cardiomegaly. Continued left basilar airspace opacity and small left effusion, not significantly changed. No confluent opacity on the right. No acute bony abnormality. IMPRESSION: Stable left lower lobe infiltrate with small left effusion. Electronically Signed   By: Rolm Baptise M.D.   On: 07/27/2015 07:26   Dg Chest Port 1 View  07/26/2015  CLINICAL DATA:  Decreased oxygen saturation levels EXAM: PORTABLE CHEST 1 VIEW COMPARISON:  October 18, 2008 FINDINGS: There is left base consolidation with small left effusion. Lungs elsewhere clear. Heart is enlarged with pulmonary vascularity within normal limits. No adenopathy. There is extensive atherosclerotic calcification in aorta. IMPRESSION: Medial  opacity left  base consistent with focal airspace consolidation with small effusion. Lungs elsewhere clear. Stable cardiomegaly. Electronically Signed   By: Lowella Grip III M.D.   On: 07/26/2015 08:13     ASSESSMENT AND PLAN:   74 year old female with a history of essential hypertension, chronic kidney disease stage III and CAD who is status post total hip arthroplasty and was found to have hyperkalemia.  1. Hyperkalemia: I suspect this could be related to losartan which she was taking at home for her blood pressure. This medication has been stopped and should be discontinued at discharge until she can follow up with her primary care physician. Potassium has improved. She had no EKG changes suggestive of hyperkalemia.  2. Essential hypertension: Patient will continue diltiazem and in the place of losartan I have added hydralazine 25 mg by mouth twice a day which the patient should be discharged on.  3. CAD: Continue diltiazem and simvastatin and aspirin.  4. Chronic kidney disease stage III: Creatinine is stable.  5. Anemia of chronic disease: Hemoglobin is stable.  6. Hypoxic respiratory failure: This is secondary to pneumonia. I suspect the patient had pneumonia prior to coming to the hospital as she reports. Patient is allergic to Levaquin and therefore have started ROCEPHIN and azithromycin 500 mg daily. She is also allergic to amoxicillin (diarrhea). She should be treated for 5-7 days. Oxygen will be titrated as tolerated.I will start steroids as well today to see if this helps. If she is off of O2 today and is going home please d/c on PO AZITHRO and steroids 1 days taper starting at 50 mg.       Management plans discussed with the patient and she is in agreement.  CODE STATUS: Full  TOTAL TIME TAKING CARE OF THIS PATIENT: 25 minutes.     POSSIBLE D/Ctoday or tomorrow, DEPENDING ON CLINICAL CONDITION.   Awilda Covin M.D on 07/27/2015 at 8:07 AM  Between 7am to 6pm -  Pager - [phone] After 6pm go to [email or web address] - password [email or web address] [place] [facility]  Office  [phone]  CC: Primary care physician; Kirk Ruths., MD  Note: This dictation was prepared with Dragon dictation along with smaller phrase technology. Any transcriptional errors that result from this process are unintentional.

## 2015-07-27 NOTE — Progress Notes (Addendum)
Paged MD due to call from Telemetry that pt having frequent PAC's, bigeminy, Sustained 72. Dr. Benjie Karvonen ordered Cardiology consult, recheck Met B and Mag levels.

## 2015-07-27 NOTE — Care Management Note (Signed)
Case Management Note  Patient Details  Name: Kathleen Cox MRN: [id number] Date of Birth: [birth year]  Subjective/Objective:     [name] reports that her insurance company will only authorize prescriptions to be filled at either [facility] or [facility] in [place]. [name] requests that she wants all her prescriptions filled at [facility] in [place]. Charge Nurse Dava will enter this information into EPIC.                Action/Plan:   Expected Discharge Date:                  Expected Discharge Plan:  [place]  In-House Referral:     Discharge planning Services  CM Consult  Post Acute Care Choice:  Home Health, Durable Medical Equipment Choice offered to:  Patient, Spouse  DME Arranged:  [facility] D[facility] Agency:  [place]:  RN, PT [facility] Agency:  [place]  Status of Service:  Completed, signed off  Medicare Important Message Given:  Yes Date Medicare IM Given:    Medicare IM give by:    Date Additional Medicare IM Given:    Additional Medicare Important Message give by:     If discussed at [place] of Stay Meetings, dates discussed:    Additional Comments:  Dillinger Aston A, RN 07/27/2015, 11:29 AM

## 2015-07-27 NOTE — Progress Notes (Signed)
Physical Therapy Treatment Patient Details Name: Kathleen Cox MRN: [id number] DOB: 24-Mar-1941 Today's Date: 07/27/2015    History of Present Illness Pt admitted for R THR on 07/23/15.    PT Comments    Pt notes fatigue due to poor sleep last night and also shortness of breath due to cough. Pt O2 saturation at 92% on 1 liter throughout session. Pt refuses out of bed/ambulation at this time due to mentioned complaints. Performs supine bed exercises well with 6/10 pain; no increase. Plan to see pt this afternoon for continued strengthening and hopeful out of bed activity/ambulation to improve overall functional mobility.   Follow Up Recommendations  Home health PT;Supervision/Assistance - 24 hour     Equipment Recommendations       Recommendations for Other Services       Precautions / Restrictions Precautions Precautions: Anterior Hip Restrictions Weight Bearing Restrictions: Yes RLE Weight Bearing: Weight bearing as tolerated    Mobility  Bed Mobility               General bed mobility comments: Not tested; refuses out of bed  Transfers                    Ambulation/Gait                 Stairs            Wheelchair Mobility    Modified Rankin (Stroke Patients Only)       Balance                                    Cognition Arousal/Alertness: Lethargic (pt did not sleep much last night) Behavior During Therapy: WFL for tasks assessed/performed;Anxious Overall Cognitive Status: Within Functional Limits for tasks assessed                      Exercises Total Joint Exercises Ankle Circles/Pumps: AROM;Both;20 reps;Supine Quad Sets: Strengthening;Both;20 reps;Supine Gluteal Sets: Strengthening;Both;20 reps;Supine Towel Squeeze: Strengthening;Both;20 reps;Supine Short Arc Quad: AROM;Both;20 reps;Supine Heel Slides: AROM;Both;20 reps;Supine Hip ABduction/ADduction: AROM;Both;20 reps;Supine Straight Leg  Raises: AAROM;Both;20 reps;Supine    General Comments        Pertinent Vitals/Pain Pain Assessment: 0-10 Pain Score: 6  Pain Location: R hip Pain Descriptors / Indicators: Aching;Constant Pain Intervention(s): Limited activity within patient's tolerance;Premedicated before session (activity more limited due to SOB)    Home Living                      Prior Function            PT Goals (current goals can now be found in the care plan section)      Frequency  BID    PT Plan Current plan remains appropriate    Co-evaluation             End of Session   Activity Tolerance: No increased pain;Patient limited by fatigue (limited by SOB/cough) Patient left: in bed;with call bell/phone within reach;with bed alarm set;with family/visitor present;with SCD's reapplied     Time: [id number] PT Time Calculation (min) (ACUTE ONLY): 25 min  Charges:  $Therapeutic Exercise: 23-37 mins                    G Codes:      Charlaine Dalton 07/27/2015, 10:50 AM

## 2015-07-27 NOTE — Progress Notes (Signed)
Pt not cleared for discharge to home this shift due to activity intolerance, O2 sat dropped to 80's with activity on O2 @ 1L.Increased to 2L, pt tolerated activity for short period at 92%.

## 2015-07-27 NOTE — Progress Notes (Signed)
Pt has only ambulated 35 feet since  Developing PNA. RECOMMENDED HOME WITH PT. POOR ACTIVITY TOLERANCE. DR MODY  REPORTS PT IS CLEARED FOR DISCHARGE. RN CALLED CHRIS GAINES  WHO WILL DISCUSS DISCHARGE WITH DR [facility]

## 2015-07-27 NOTE — Progress Notes (Addendum)
Physical Therapy Treatment Patient Details Name: Kathleen Cox MRN: [id number] DOB: [birth year] Today's Date: 07/27/2015    History of Present Illness Pt admitted for R THR on 07/23/15.    PT Comments    Pt up in chair. Poor tolerance of stand activities/exercise due to O2 desaturation post stand exercises to 85%. Nursing notified and increased O2 level to 2 liters post session. Pt wished to remain up in chair. Encouraged continued isometric and range of motion exercises as well as breathing spirometer. Continue PT to progress strength and activity tolerance with safe O2 levels to improve all functional mobility/ambulation (ambulation has decreased daily since admit) to allow safe return home.   Follow Up Recommendations  Home health PT;Supervision/Assistance - 24 hour     Equipment Recommendations       Recommendations for Other Services       Precautions / Restrictions Precautions Precautions: Anterior Hip Restrictions Weight Bearing Restrictions: Yes RLE Weight Bearing: Weight bearing as tolerated    Mobility  Bed Mobility               General bed mobility comments: up in chair  Transfers Overall transfer level: Needs assistance Equipment used: Rolling walker (2 wheeled) Transfers: Sit to/from Stand Sit to Stand: Min guard (cues for proper hand placement)         General transfer comment: STS from recliner with cues for safe hand placement; performed several times during stand exercises  Ambulation/Gait             General Gait Details: Unable due to O2 desaturation   Stairs            Wheelchair Mobility    Modified Rankin (Stroke Patients Only)       Balance Overall balance assessment: Needs assistance         Standing balance support: Bilateral upper extremity supported Standing balance-Leahy Scale: Fair                      Cognition Arousal/Alertness: Lethargic (pt did not sleep much last night) Behavior During  Therapy: WFL for tasks assessed/performed;Anxious Overall Cognitive Status: Within Functional Limits for tasks assessed                      Exercises Total Joint Exercises Ankle Circles/Pumps: AROM;Both;20 reps (long sit) Quad Sets: Strengthening;Both;20 reps (long sit) Gluteal Sets: Strengthening;Both;20 reps (long sit) Towel Squeeze: Strengthening;Both;20 reps (long sit) Short Arc Quad: AROM;Both;20 reps;Supine Heel Slides: AROM;Both;20 reps;Supine Hip ABduction/ADduction: AROM;Right;10 reps;Standing (2 sets; seated rest in between) Straight Leg Raises: AROM;Right;5 reps;Standing (3 sets; seated rest in between) Long Arc Quad: AROM;Both;20 reps;Seated    General Comments        Pertinent Vitals/Pain Pain Assessment: 0-10 Pain Score: 6  Pain Location: R hip Pain Descriptors / Indicators: Aching;Constant Pain Intervention(s): Limited activity within patient's tolerance    Home Living                      Prior Function            PT Goals (current goals can now be found in the care plan section)      Frequency  BID    PT Plan Current plan remains appropriate    Co-evaluation             End of Session   Activity Tolerance: Patient limited by fatigue (limited by poor O2 saturation) Patient left: with call  bell/phone within reach;in chair;with chair alarm set (refused foot pumps)     Time: 1241-1311 PT Time Calculation (min) (ACUTE ONLY): 30 min  Charges:  $Therapeutic Exercise: 8-22 mins $Therapeutic Activity: 8-22 mins                    G Codes:      Charlaine Dalton 07/27/2015, 1:23 PM

## 2015-07-28 MED ORDER — AZITHROMYCIN 250 MG PO TABS: 250.0000 mg | ORAL_TABLET | Freq: Every day | ORAL | Status: DC

## 2015-07-28 MED ORDER — BISACODYL 5 MG PO TBEC
10.0000 mg | DELAYED_RELEASE_TABLET | Freq: Every day | ORAL | Status: DC | PRN
  Administered 2015-07-28: 10 mg via ORAL
  Filled 2015-07-28: qty 2

## 2015-07-28 NOTE — Progress Notes (Signed)
rn spoke  With chris gaines, pa re: pt can be discharged home with home health now. Has been cleared cardiology wise

## 2015-07-28 NOTE — Progress Notes (Signed)
Patient discharging home. Instructions given to patient and husband, verbalized understanding. Husband providing transportation home. IV removed.

## 2015-07-28 NOTE — Progress Notes (Signed)
[place] [facility] [place] NAME: Kathleen Cox    MR#:  [id number]  DATE OF BIRTH:  [birth year]  SUBJECTIVE:   She is reporting that she really wants to go home. She was not discharged yesterday due to hypoxia. Telemetry showed PACs yesterday. Potassium and magnesium level were checked which were normal.  REVIEW OF SYSTEMS:    Review of Systems  Constitutional: Negative for fever, chills and malaise/fatigue.  HENT: Negative for sore throat.   Eyes: Negative for blurred vision.  Respiratory: Positive for cough. Negative for hemoptysis, shortness of breath and wheezing.   Cardiovascular: Negative for chest pain, palpitations and leg swelling.  Gastrointestinal: Negative for nausea, vomiting, abdominal pain, diarrhea and blood in stool.  Genitourinary: Negative for dysuria.  Musculoskeletal: Negative for back pain.  Neurological: Negative for dizziness, tremors and headaches.  Endo/Heme/Allergies: Does not bruise/bleed easily.    Tolerating Diet: Yes      DRUG ALLERGIES:   Allergies  Allergen Reactions  . Amoxicillin Diarrhea  . Levofloxacin Other (See Comments)    Muscle pain.  Marland Kitchen Percocet [Oxycodone-Acetaminophen] Other (See Comments)    hallucinate  . Neosporin [Neomycin-Bacitracin Zn-Polymyx] Rash    VITALS:  Blood pressure 144/47, pulse 66, temperature 98.4 F (36.9 C), temperature source Oral, resp. rate 18, height 5' (1.524 m), weight 62.097 kg (136 lb 14.4 oz), SpO2 94 %.  PHYSICAL EXAMINATION:   Physical Exam  Constitutional: She is oriented to person, place, and time and well-developed, well-nourished, and in no distress. No distress.  HENT:  Head: Normocephalic.  Eyes: No scleral icterus.  Neck: Normal range of motion. Neck supple. No JVD present. No tracheal deviation present.  Cardiovascular: Normal rate, regular rhythm and normal heart sounds.  Exam reveals no gallop and no friction rub.   No murmur  heard. Pulmonary/Chest: Effort normal and breath sounds normal. No respiratory distress. She has no wheezes. She has no rales. She exhibits no tenderness.  Abdominal: Soft. Bowel sounds are normal. She exhibits no distension and no mass. There is no tenderness. There is no rebound and no guarding.  Musculoskeletal: Normal range of motion. She exhibits no edema.  Neurological: She is alert and oriented to person, place, and time.  Skin: Skin is warm. No rash noted. No erythema.  Psychiatric: Affect and judgment normal.      LABORATORY PANEL:   CBC  Recent Labs Lab 07/27/15 0350  WBC 6.0  HGB 9.7*  HCT 29.3*  PLT 189   ------------------------------------------------------------------------------------------------------------------  Chemistries   Recent Labs Lab 07/27/15 1750  NA 135  K 4.8  CL 103  CO2 27  GLUCOSE 207*  BUN 19  CREATININE 1.23*  CALCIUM 9.3  MG 2.0   ------------------------------------------------------------------------------------------------------------------  Cardiac Enzymes No results for input(s): TROPONINI in the last 168 hours. ------------------------------------------------------------------------------------------------------------------  RADIOLOGY:  Dg Chest 1 View  07/27/2015  CLINICAL DATA:  Pneumonia EXAM: CHEST 1 VIEW COMPARISON:  07/26/2015 FINDINGS: Cardiomegaly. Continued left basilar airspace opacity and small left effusion, not significantly changed. No confluent opacity on the right. No acute bony abnormality. IMPRESSION: Stable left lower lobe infiltrate with small left effusion. Electronically Signed   By: Rolm Baptise M.D.   On: 07/27/2015 07:26     ASSESSMENT AND PLAN:   74 year old female with a history of essential hypertension, chronic kidney disease stage III and CAD who is status post total hip arthroplasty and was found to have hyperkalemia.  1. Hyperkalemia: I suspect this could be  related to losartan which she  was taking at home for her blood pressure. This medication has been stopped and should be discontinued at discharge until she can follow up with her primary care physician. Potassium has improved. She had no EKG changes suggestive of hyperkalemia. BMP pending for the same.  2. Essential hypertension: Patient will continue diltiazem and in the place of losartan I have added hydralazine 25 mg by mouth twice a day which the patient should be discharged on.  3. CAD: Continue diltiazem and simvastatin and aspirin.  4. Chronic kidney disease stage III: Creatinine is stable.  5. Anemia of chronic disease: Hemoglobin is stable.  6. Hypoxic respiratory failure: This is secondary to pneumonia. I suspect the patient had pneumonia prior to coming to the hospital as she reports. Patient is allergic to Levaquin and therefore have started ROCEPHIN and azithromycin 500 mg daily. She is also allergic to amoxicillin (diarrhea). She should be treated for 5-7 days. Oxygen will be titrated as tolerated.she will need home oxygen. She'll also need steroid taper at discharge. If she is off of O2 today and is going home please d/c on PO AZITHRO and steroids 1 days taper starting at 50 mg.   7. PAC: I suspect this is from hypoxia. Potassium magnesium were within normal limits. Cardiology was consulted. Echocardiogram performed 2 days ago shows normal ejection fraction without mention of diastolic dysfunction.   It is likely patient may be able to be discharged later today if she does not have a significant amount of shortness of breath and after cardiology consultation.   Management plans discussed with the patient and she is in agreement.  CODE STATUS: Full  TOTAL TIME TAKING CARE OF THIS PATIENT: 25 minutes.  Thank you for allowing me to person the care of the patient.   POSSIBLE D/Ct today or tomorrow, DEPENDING ON CLINICAL CONDITION.   Longino Trefz M.D on 07/28/2015 at 9:32 AM  Between 7am to 6pm - Pager -  [phone] After 6pm go to [email or web address] - password [email or web address] [facility]  Office  [phone]  CC: Primary care physician; Kirk Ruths., MD  Note: This dictation was prepared with Dragon dictation along with smaller phrase technology. Any transcriptional errors that result from this process are unintentional.

## 2015-07-28 NOTE — Progress Notes (Signed)
Patient states she had a horrible nightmare last night and that she had taken Ambien last night to help with sleep. She thinks it may have come from the medication. Patient states she does not want to take that anymore,a nd would rather have a xanax instead. Continue to monitor.

## 2015-07-28 NOTE — Progress Notes (Signed)
Physical Therapy Treatment Patient Details Name: Kathleen Cox MRN: [id number] DOB: 27-Aug-1940 Today's Date: 07/28/2015    History of Present Illness Pt admitted for R THR on 07/23/15.    PT Comments    Pt demonstrating improved ambulation distance today; no increases in pain (currently 6/10 right thigh) with pain medicine given during session. Pt does complains of fatigue and O2 saturation decreases in short 40 ft walk on room air to 87% and to 88% on 1 liter for a 100 ft walk. Pt recovers in 1 minute to 94-95% on 1 liter with seated rest. Currently, pt requires 2 liters with ambulation/stand activities to maintain O2 saturation at a safe level. Pt participates in seated/long sit exercises with safe O2 saturation levels on 1 liter. Plan to see pt this afternoon for continued ambulation and strengthening.   Follow Up Recommendations  Home health PT;Supervision/Assistance - 24 hour     Equipment Recommendations       Recommendations for Other Services       Precautions / Restrictions Precautions Precautions: Anterior Hip Restrictions Weight Bearing Restrictions: Yes RLE Weight Bearing: Weight bearing as tolerated    Mobility  Bed Mobility               General bed mobility comments: Not tested; up in chair  Transfers Overall transfer level: Needs assistance Equipment used: Rolling walker (2 wheeled) Transfers: Sit to/from Stand Sit to Stand: Supervision         General transfer comment: Improved safety with hand placement today  Ambulation/Gait Ambulation/Gait assistance: Supervision Ambulation Distance (Feet): 40 Feet (initial walk on room air; saturation decreased to 87%) Assistive device: Rolling walker (2 wheeled) Gait Pattern/deviations: Step-through pattern     General Gait Details: Second walk on 1L O2 100 ft with decreased O2 saturation to 88%; recovered in 1 minute to 94-95%   Stairs            Wheelchair Mobility    Modified Rankin  (Stroke Patients Only)       Balance Overall balance assessment: Needs assistance         Standing balance support: Bilateral upper extremity supported Standing balance-Leahy Scale: Fair                      Cognition Arousal/Alertness: Awake/alert Behavior During Therapy: WFL for tasks assessed/performed;Anxious Overall Cognitive Status: Within Functional Limits for tasks assessed                      Exercises Total Joint Exercises Ankle Circles/Pumps: AROM;Both;20 reps Quad Sets: Strengthening;Both;20 reps Gluteal Sets: Strengthening;Both;20 reps Heel Slides: AROM;Both;20 reps Hip ABduction/ADduction: AROM;Both;20 reps Long Arc Quad: AROM;Both;20 reps;Seated    General Comments        Pertinent Vitals/Pain Pain Assessment: 0-10 Pain Score: 6  Pain Location: R thigh Pain Intervention(s): Monitored during session;Patient requesting pain meds-RN notified    Home Living                      Prior Function            PT Goals (current goals can now be found in the care plan section) Progress towards PT goals: Progressing toward goals    Frequency  BID    PT Plan Current plan remains appropriate    Co-evaluation             End of Session Equipment Utilized During Treatment: Gait belt Activity Tolerance: Patient  limited by fatigue Patient left: in chair;with call bell/phone within reach;with chair alarm set     Time: [id number] PT Time Calculation (min) (ACUTE ONLY): 36 min  Charges:  $Gait Training: 8-22 mins $Therapeutic Exercise: 8-22 mins                    G Codes:      Charlaine Dalton 07/28/2015, 10:33 AM

## 2015-07-28 NOTE — Progress Notes (Signed)
   Subjective: 5 Days Post-Op Procedure(s) (LRB): TOTAL HIP ARTHROPLASTY ANTERIOR APPROACH (Right) Patient reports pain as 2 on 0-10 scale.   Patient is well, and has had no acute complaints or problems  No CP, ABD pain. SOB with activity yesterday. Plan is to go Home after hospital stay.    Objective: Vital signs in last 24 hours: Temp:  [98 F (36.7 C)-98.6 F (37 C)] 98.4 F (36.9 C) (12/13 0730) Pulse Rate:  [58-75] 66 (12/13 0730) Resp:  [18-20] 18 (12/13 0730) BP: (116-144)/(42-73) 144/47 mmHg (12/13 0730) SpO2:  [92 %-99 %] 94 % (12/13 0730)  Intake/Output from previous day: 12/12 0701 - 12/13 0700 In: 960 [P.O.:960] Out: 350 [Urine:350] Intake/Output this shift:     Recent Labs  07/26/15 0334 07/27/15 0350  HGB 9.8* 9.7*    Recent Labs  07/26/15 0334 07/27/15 0350  WBC 6.7 6.0  RBC 3.08* 3.09*  HCT 29.1* 29.3*  PLT 197 189    Recent Labs  07/26/15 0334 07/27/15 1750  NA 136 135  K 4.6 4.8  CL 105 103  CO2 27 27  BUN 15 19  CREATININE 1.05* 1.23*  GLUCOSE 85 207*  CALCIUM 8.7* 9.3   No results for input(s): LABPT, INR in the last 72 hours.  EXAM General - Patient is Alert, Appropriate and Oriented  Lungs- no wheezing, good air movement bilaterally Extremity - Neurologically intact Neurovascular intact Sensation intact distally Intact pulses distally Dorsiflexion/Plantar flexion intact  - homans sign BLE Dressing - dressing C/D/I and scant drainage Motor Function - intact, moving foot and toes well on exam.   Past Medical History  Diagnosis Date  . Complication of anesthesia     pt heard the drill during surgery.  . Coronary artery disease   . Hypertension   . COPD (chronic obstructive pulmonary disease) ([place])   . Depression   . Chronic kidney disease 2015    stage 3  . GERD (gastroesophageal reflux disease)   . Arthritis     Assessment/Plan:   5 Days Post-Op Procedure(s) (LRB): TOTAL HIP ARTHROPLASTY ANTERIOR APPROACH  (Right) Active Problems:   Primary osteoarthritis of right hip   Acute post op blood loss anemia    Estimated body mass index is 26.74 kg/(m^2) as calculated from the following:   Height as of this encounter: 5' (1.524 m).   Weight as of this encounter: 62.097 kg (136 lb 14.4 oz). Advance diet Up with therapy  Discharge to home with home health pending medical clearance and progress with PT. Cardiology consult pending today Follow up with [facility] in 2 weeks for staple removal Will Treat PNA outpatient with Azithromycin and prednisone  DVT Prophylaxis - Lovenox, Foot Pumps and TED hose Weight-Bearing as tolerated to right leg D/C O2 and Pulse OX and try on Room Air  T. Rachelle Hora, PA-C [place] 07/28/2015, 8:11 AM

## 2015-07-28 NOTE — Consult Note (Signed)
[facility]  CARDIOLOGY CONSULT NOTE  Patient ID: Kathleen Cox MRN: [id number] DOB/AGE: October 26, 1940 74 y.o.  Admit date: 07/23/2015 Referring Physician Dr. Velia Meyer Primary Physician   Primary Cardiologist   Reason for Consultation PACs on telemetry  HPI: Pt is a 74 yo female with history of ckd 3, hypertension and cad admitted with hip pain treated with total hip arthroplasty by orthopedics. She was noted to have bradycardia and noted to have pacs with atrial bigeminy on occasion on telemetry. No evidence of syncope or presyncope. No ventricular or supraventricular arrythmia. No evidenc of ischemia. Potassium is 4.8. Creatinine 1.23. Echo showed normal lv function with no signficant valve abnormalities. cxr yesterday showed lll infiltrate with small effusion. She is improved clnically today  ROS Review of Systems - History obtained from chart review and the patient General ROS: positive for  - hip pain Respiratory ROS: positive for - cough and shortness of breath Cardiovascular ROS: no chest pain or dyspnea on exertion Musculoskeletal ROS: positive for - joint pain Neurological ROS: no TIA or stroke symptoms   Past Medical History  Diagnosis Date  . Complication of anesthesia     pt heard the drill during surgery.  . Coronary artery disease   . Hypertension   . COPD (chronic obstructive pulmonary disease) ([employer])   . Depression   . Chronic kidney disease 2015    stage 3  . GERD (gastroesophageal reflux disease)   . Arthritis     History reviewed. No pertinent family history.  Social History   Social History  . Marital Status: Married    Spouse Name: N/A  . Number of Children: N/A  . Years of Education: N/A   Occupational History  . Not on file.   Social History Main Topics  . Smoking status: Current Every Day Smoker -- 0.50 packs/day  . Smokeless tobacco: Not on file  . Alcohol Use: No  . Drug Use: No  . Sexual Activity: Not  on file   Other Topics Concern  . Not on file   Social History Narrative    Past Surgical History  Procedure Laterality Date  . Cardiac catheterization  2006    stent placement  . Carotid endarterectomy Right 2013  . Abdominal hysterectomy    . Joint replacement Left 2012    hip  . Cataract extraction w/ intraocular lens implant Right 2010  . Cataract extraction w/ intraocular lens implant Left 2011  . Total hip arthroplasty Right 07/23/2015    Procedure: TOTAL HIP ARTHROPLASTY ANTERIOR APPROACH;  Surgeon: Hessie Knows, MD;  Location: [facility] ORS;  Service: Orthopedics;  Laterality: Right;     Prescriptions prior to admission  Medication Sig Dispense Refill Last Dose  . diltiazem (DILACOR XR) 240 MG 24 hr capsule Take 240 mg by mouth daily. In am   07/23/2015 at 0430  . losartan (COZAAR) 100 MG tablet Take 100 mg by mouth daily. In am   07/23/2015 at 0430  . omeprazole (PRILOSEC OTC) 20 MG tablet Take 20 mg by mouth daily. In am   07/23/2015 at 0430  . tiotropium (SPIRIVA) 18 MCG inhalation capsule Place 18 mcg into inhaler and inhale daily as needed.   07/23/2015 at 0430  . ALPRAZolam (XANAX XR) 0.5 MG 24 hr tablet Take 0.5 mg by mouth daily as needed for anxiety.   07/16/2015  . Ascorbic Acid (VITAMIN C PO) Take 1 tablet by mouth daily.   06/23/2015  . aspirin 81  MG tablet Take 81 mg by mouth daily. In am   07/19/2015  . calcium citrate-vitamin D (CITRACAL+D) 315-200 MG-UNIT tablet Take 2 tablets by mouth daily.   07/16/2015  . HYDROcodone-acetaminophen (NORCO/VICODIN) 5-325 MG tablet Take 1 tablet by mouth every 6 (six) hours as needed for moderate pain.   07/22/2015  . simvastatin (ZOCOR) 10 MG tablet Take 10 mg by mouth at bedtime. At bedtime.   07/22/2015    Physical Exam: Blood pressure 144/47, pulse 64, temperature 98.4 F (36.9 C), temperature source Oral, resp. rate 18, height 5' (1.524 m), weight 62.097 kg (136 lb 14.4 oz), SpO2 96 %.   General appearance: alert and  cooperative Resp: clear to auscultation bilaterally Chest wall: no tenderness Cardio: regularly irregular rhythm GI: soft, non-tender; bowel sounds normal; no masses,  no organomegaly Extremities: extremities normal, atraumatic, no cyanosis or edema Neurologic: Grossly normal Labs:   Lab Results  Component Value Date   WBC 6.0 07/27/2015   HGB 9.7* 07/27/2015   HCT 29.3* 07/27/2015   MCV 94.7 07/27/2015   PLT 189 07/27/2015    Recent Labs Lab 07/27/15 1750  NA 135  K 4.8  CL 103  CO2 27  BUN 19  CREATININE 1.23*  CALCIUM 9.3  GLUCOSE 207*   No results found for: CKTOTAL, CKMB, CKMBINDEX, TROPONINI    Radiology: infiltrate in lll EKG: sr with pacs  ASSESSMENT AND PLAN:  74 yo female s/p hip surgery with pacs on telemtry. No significant arrythmia. EF normal. OK for discharge on current meds. Follow up as out patient if desired.  Signed: Teodoro Spray MD, [facility] 07/28/2015, 11:03 AM

## 2015-07-28 NOTE — Progress Notes (Signed)
Patient has not had a BM since 12/10. I offered her some milk of magnesia, she did not want it. She states she wants the dulcolax pills. Dr Benjie Karvonen paged, waiting on callback.

## 2015-08-13 ENCOUNTER — Other Ambulatory Visit: Payer: Self-pay | Admitting: Internal Medicine

## 2015-08-13 ENCOUNTER — Other Ambulatory Visit: Payer: Medicare Other

## 2015-08-13 ENCOUNTER — Ambulatory Visit
Admission: RE | Admit: 2015-08-13 | Discharge: 2015-08-13 | Disposition: A | Discharge status: Home / Self Care | Payer: Medicare Other | Source: Ambulatory Visit | Attending: Internal Medicine | Admitting: Internal Medicine

## 2015-08-13 DIAGNOSIS — R600 Localized edema: Secondary | ICD-10-CM | POA: Diagnosis present

## 2015-08-17 ENCOUNTER — Other Ambulatory Visit: Payer: Medicare Other

## 2015-08-18 ENCOUNTER — Other Ambulatory Visit: Payer: Medicare Other

## 2015-09-07 ENCOUNTER — Ambulatory Visit: Payer: Medicare Other

## 2015-09-07 ENCOUNTER — Other Ambulatory Visit: Payer: Self-pay | Admitting: Orthopedic Surgery

## 2015-09-07 DIAGNOSIS — M7989 Other specified soft tissue disorders: Secondary | ICD-10-CM

## 2015-09-08 ENCOUNTER — Ambulatory Visit
Admission: RE | Admit: 2015-09-08 | Discharge: 2015-09-08 | Disposition: A | Discharge status: Home / Self Care | Payer: Medicare Other | Source: Ambulatory Visit | Attending: Orthopedic Surgery | Admitting: Orthopedic Surgery

## 2015-09-08 DIAGNOSIS — R600 Localized edema: Secondary | ICD-10-CM | POA: Diagnosis not present

## 2015-09-08 DIAGNOSIS — Z96641 Presence of right artificial hip joint: Secondary | ICD-10-CM | POA: Diagnosis present

## 2015-09-08 DIAGNOSIS — M7989 Other specified soft tissue disorders: Secondary | ICD-10-CM | POA: Diagnosis not present

## 2015-09-10 ENCOUNTER — Ambulatory Visit
Admission: RE | Admit: 2015-09-10 | Discharge: 2015-09-10 | Disposition: A | Discharge status: Home / Self Care | Payer: Medicare Other | Source: Ambulatory Visit | Attending: Internal Medicine | Admitting: Internal Medicine

## 2015-09-10 DIAGNOSIS — Z1231 Encounter for screening mammogram for malignant neoplasm of breast: Secondary | ICD-10-CM | POA: Insufficient documentation

## 2016-01-19 ENCOUNTER — Other Ambulatory Visit: Payer: Self-pay | Admitting: Physical Medicine and Rehabilitation

## 2016-01-19 DIAGNOSIS — M5416 Radiculopathy, lumbar region: Secondary | ICD-10-CM

## 2016-02-05 ENCOUNTER — Ambulatory Visit
Admission: RE | Admit: 2016-02-05 | Discharge: 2016-02-05 | Disposition: A | Discharge status: Home / Self Care | Payer: Medicare Other | Source: Ambulatory Visit | Attending: Physical Medicine and Rehabilitation | Admitting: Physical Medicine and Rehabilitation

## 2016-02-05 DIAGNOSIS — M4806 Spinal stenosis, lumbar region: Secondary | ICD-10-CM | POA: Diagnosis not present

## 2016-02-05 DIAGNOSIS — I700 Atherosclerosis of aorta: Secondary | ICD-10-CM | POA: Insufficient documentation

## 2016-02-05 DIAGNOSIS — M5416 Radiculopathy, lumbar region: Secondary | ICD-10-CM | POA: Diagnosis present

## 2016-02-05 DIAGNOSIS — M479 Spondylosis, unspecified: Secondary | ICD-10-CM | POA: Diagnosis not present

## 2016-08-26 DIAGNOSIS — M5136 Other intervertebral disc degeneration, lumbar region: Secondary | ICD-10-CM | POA: Diagnosis not present

## 2016-08-26 DIAGNOSIS — M48062 Spinal stenosis, lumbar region with neurogenic claudication: Secondary | ICD-10-CM | POA: Diagnosis not present

## 2016-08-26 DIAGNOSIS — M5412 Radiculopathy, cervical region: Secondary | ICD-10-CM | POA: Diagnosis not present

## 2016-08-26 DIAGNOSIS — M5416 Radiculopathy, lumbar region: Secondary | ICD-10-CM | POA: Diagnosis not present

## 2016-08-26 DIAGNOSIS — M5030 Other cervical disc degeneration, unspecified cervical region: Secondary | ICD-10-CM | POA: Diagnosis not present

## 2016-08-30 DIAGNOSIS — R739 Hyperglycemia, unspecified: Secondary | ICD-10-CM | POA: Diagnosis not present

## 2016-08-30 DIAGNOSIS — N183 Chronic kidney disease, stage 3 (moderate): Secondary | ICD-10-CM | POA: Diagnosis not present

## 2016-08-30 DIAGNOSIS — I2510 Atherosclerotic heart disease of native coronary artery without angina pectoris: Secondary | ICD-10-CM | POA: Diagnosis not present

## 2016-09-06 DIAGNOSIS — I2510 Atherosclerotic heart disease of native coronary artery without angina pectoris: Secondary | ICD-10-CM | POA: Diagnosis not present

## 2016-09-06 DIAGNOSIS — F331 Major depressive disorder, recurrent, moderate: Secondary | ICD-10-CM | POA: Diagnosis not present

## 2016-09-06 DIAGNOSIS — I779 Disorder of arteries and arterioles, unspecified: Secondary | ICD-10-CM | POA: Diagnosis not present

## 2016-09-06 DIAGNOSIS — Z0000 Encounter for general adult medical examination without abnormal findings: Secondary | ICD-10-CM | POA: Diagnosis not present

## 2016-09-06 DIAGNOSIS — R739 Hyperglycemia, unspecified: Secondary | ICD-10-CM | POA: Diagnosis not present

## 2016-09-06 DIAGNOSIS — J449 Chronic obstructive pulmonary disease, unspecified: Secondary | ICD-10-CM | POA: Diagnosis not present

## 2016-09-06 DIAGNOSIS — I700 Atherosclerosis of aorta: Secondary | ICD-10-CM | POA: Diagnosis not present

## 2016-09-08 ENCOUNTER — Telehealth: Payer: Self-pay | Admitting: *Deleted

## 2016-09-08 DIAGNOSIS — Z87891 Personal history of nicotine dependence: Secondary | ICD-10-CM

## 2016-09-08 NOTE — Telephone Encounter (Signed)
Received referral for initial lung cancer screening scan. Contacted patient and obtained smoking history,(former, quit July 2017, 50 pack year) as well as answering questions related to screening process. Patient denies signs of lung cancer such as weight loss or hemoptysis. Patient denies comorbidity that would prevent curative treatment if lung cancer were found. Patient is tentatively scheduled for shared decision making visit and CT scan on 09/27/16, pending insurance approval from business office.

## 2016-09-27 ENCOUNTER — Encounter: Payer: Self-pay | Admitting: Oncology

## 2016-09-27 ENCOUNTER — Inpatient Hospital Stay: Payer: PPO | Admitting: Oncology

## 2016-09-27 ENCOUNTER — Ambulatory Visit: Payer: PPO

## 2016-09-28 ENCOUNTER — Telehealth: Payer: Self-pay | Admitting: *Deleted

## 2016-09-28 NOTE — Telephone Encounter (Signed)
Patient was rescheduled for the lung screening scan for date that shared decision making visit is not available. Patient is rescheduled for 10/12/16.

## 2016-10-06 ENCOUNTER — Ambulatory Visit: Payer: PPO

## 2016-10-12 ENCOUNTER — Ambulatory Visit
Admission: RE | Admit: 2016-10-12 | Discharge: 2016-10-12 | Disposition: A | Discharge status: Home / Self Care | Payer: PPO | Source: Ambulatory Visit | Attending: Oncology | Admitting: Oncology

## 2016-10-12 ENCOUNTER — Encounter (INDEPENDENT_AMBULATORY_CARE_PROVIDER_SITE_OTHER): Payer: Self-pay

## 2016-10-12 ENCOUNTER — Ambulatory Visit: Payer: PPO

## 2016-10-12 ENCOUNTER — Inpatient Hospital Stay: Payer: PPO | Attending: Oncology | Admitting: Oncology

## 2016-10-12 DIAGNOSIS — Z87891 Personal history of nicotine dependence: Secondary | ICD-10-CM

## 2016-10-12 DIAGNOSIS — Z122 Encounter for screening for malignant neoplasm of respiratory organs: Secondary | ICD-10-CM

## 2016-10-12 NOTE — Progress Notes (Signed)
Personal history of tobacco use, presenting hazards to health In accordance with CMS guidelines, patient has met eligibility criteria including age, absence of signs or symptoms of lung cancer.  Social History  Substance Use Topics  . Smoking status: Former Smoker    Packs/day: 1.00    Years: 50.00    Quit date: 02/13/2016  . Smokeless tobacco: Not on file  . Alcohol use No     A shared decision-making session was conducted prior to the performance of CT scan. This includes one or more decision aids, includes benefits and harms of screening, follow-up diagnostic testing, over-diagnosis, false positive rate, and total radiation exposure.  Counseling on the importance of adherence to annual lung cancer LDCT screening, impact of co-morbidities, and ability or willingness to undergo diagnosis and treatment is imperative for compliance of the program.  Counseling on the importance of continued smoking cessation for former smokers; the importance of smoking cessation for current smokers, and information about tobacco cessation interventions have been given to patient including [place] and 1800 quit [place] programs.  Written order for lung cancer screening with LDCT has been given to the patient and any and all questions have been answered to the best of my abilities.   Yearly follow up will be coordinated by Burgess Estelle, Thoracic Navigator.   Lucendia Herrlich, NP  10/12/16 3:32 PM

## 2016-10-12 NOTE — Assessment & Plan Note (Signed)
In accordance with CMS guidelines, patient has met eligibility criteria including age, absence of signs or symptoms of lung cancer.  Social History  Substance Use Topics  . Smoking status: Former Smoker    Packs/day: 1.00    Years: 50.00    Quit date: 02/13/2016  . Smokeless tobacco: Not on file  . Alcohol use No     A shared decision-making session was conducted prior to the performance of CT scan. This includes one or more decision aids, includes benefits and harms of screening, follow-up diagnostic testing, over-diagnosis, false positive rate, and total radiation exposure.  Counseling on the importance of adherence to annual lung cancer LDCT screening, impact of co-morbidities, and ability or willingness to undergo diagnosis and treatment is imperative for compliance of the program.  Counseling on the importance of continued smoking cessation for former smokers; the importance of smoking cessation for current smokers, and information about tobacco cessation interventions have been given to patient including [place] and 1800 quit [place] programs.  Written order for lung cancer screening with LDCT has been given to the patient and any and all questions have been answered to the best of my abilities.   Yearly follow up will be coordinated by Burgess Estelle, Thoracic Navigator.

## 2016-10-14 ENCOUNTER — Telehealth: Payer: Self-pay | Admitting: *Deleted

## 2016-10-14 NOTE — Telephone Encounter (Signed)
Notified patient of LDCT lung cancer screening results with recommendation for 12 month follow up imaging. Also notified of incidental findings of CAD and COPD from the body of the CT report. A copy of the results will be sent to PCP. Patient verbalizes understanding.   IMPRESSION: Lung-RADS Category 1, negative. Continue annual screening with low-dose chest CT without contrast in 12 months

## 2016-11-28 DIAGNOSIS — I2510 Atherosclerotic heart disease of native coronary artery without angina pectoris: Secondary | ICD-10-CM | POA: Diagnosis not present

## 2016-11-28 DIAGNOSIS — E782 Mixed hyperlipidemia: Secondary | ICD-10-CM | POA: Diagnosis not present

## 2016-11-28 DIAGNOSIS — I779 Disorder of arteries and arterioles, unspecified: Secondary | ICD-10-CM | POA: Diagnosis not present

## 2016-11-28 DIAGNOSIS — I10 Essential (primary) hypertension: Secondary | ICD-10-CM | POA: Diagnosis not present

## 2017-01-05 DIAGNOSIS — M48062 Spinal stenosis, lumbar region with neurogenic claudication: Secondary | ICD-10-CM | POA: Diagnosis not present

## 2017-01-05 DIAGNOSIS — M5416 Radiculopathy, lumbar region: Secondary | ICD-10-CM | POA: Diagnosis not present

## 2017-01-05 DIAGNOSIS — M5136 Other intervertebral disc degeneration, lumbar region: Secondary | ICD-10-CM | POA: Diagnosis not present

## 2017-02-28 DIAGNOSIS — I2510 Atherosclerotic heart disease of native coronary artery without angina pectoris: Secondary | ICD-10-CM | POA: Diagnosis not present

## 2017-02-28 DIAGNOSIS — J449 Chronic obstructive pulmonary disease, unspecified: Secondary | ICD-10-CM | POA: Diagnosis not present

## 2017-02-28 DIAGNOSIS — I700 Atherosclerosis of aorta: Secondary | ICD-10-CM | POA: Diagnosis not present

## 2017-02-28 DIAGNOSIS — R739 Hyperglycemia, unspecified: Secondary | ICD-10-CM | POA: Diagnosis not present

## 2017-02-28 DIAGNOSIS — I779 Disorder of arteries and arterioles, unspecified: Secondary | ICD-10-CM | POA: Diagnosis not present

## 2017-03-07 DIAGNOSIS — D229 Melanocytic nevi, unspecified: Secondary | ICD-10-CM | POA: Diagnosis not present

## 2017-03-07 DIAGNOSIS — I779 Disorder of arteries and arterioles, unspecified: Secondary | ICD-10-CM | POA: Diagnosis not present

## 2017-03-07 DIAGNOSIS — J449 Chronic obstructive pulmonary disease, unspecified: Secondary | ICD-10-CM | POA: Diagnosis not present

## 2017-03-07 DIAGNOSIS — I2510 Atherosclerotic heart disease of native coronary artery without angina pectoris: Secondary | ICD-10-CM | POA: Diagnosis not present

## 2017-03-07 DIAGNOSIS — F325 Major depressive disorder, single episode, in full remission: Secondary | ICD-10-CM | POA: Diagnosis not present

## 2017-03-07 DIAGNOSIS — R05 Cough: Secondary | ICD-10-CM | POA: Diagnosis not present

## 2017-03-07 DIAGNOSIS — R739 Hyperglycemia, unspecified: Secondary | ICD-10-CM | POA: Diagnosis not present

## 2017-03-07 DIAGNOSIS — I700 Atherosclerosis of aorta: Secondary | ICD-10-CM | POA: Diagnosis not present

## 2017-03-30 DIAGNOSIS — L989 Disorder of the skin and subcutaneous tissue, unspecified: Secondary | ICD-10-CM | POA: Diagnosis not present

## 2017-03-30 DIAGNOSIS — H61009 Unspecified perichondritis of external ear, unspecified ear: Secondary | ICD-10-CM | POA: Diagnosis not present

## 2017-03-30 DIAGNOSIS — D485 Neoplasm of uncertain behavior of skin: Secondary | ICD-10-CM | POA: Diagnosis not present

## 2017-03-30 DIAGNOSIS — H61012 Acute perichondritis of left external ear: Secondary | ICD-10-CM | POA: Diagnosis not present

## 2017-04-26 DIAGNOSIS — D2322 Other benign neoplasm of skin of left ear and external auricular canal: Secondary | ICD-10-CM | POA: Diagnosis not present

## 2017-05-05 DIAGNOSIS — Z23 Encounter for immunization: Secondary | ICD-10-CM | POA: Diagnosis not present

## 2017-05-18 DIAGNOSIS — J019 Acute sinusitis, unspecified: Secondary | ICD-10-CM | POA: Diagnosis not present

## 2017-05-22 DIAGNOSIS — Z96641 Presence of right artificial hip joint: Secondary | ICD-10-CM | POA: Diagnosis not present

## 2017-06-05 DIAGNOSIS — E782 Mixed hyperlipidemia: Secondary | ICD-10-CM | POA: Diagnosis not present

## 2017-06-05 DIAGNOSIS — I6521 Occlusion and stenosis of right carotid artery: Secondary | ICD-10-CM | POA: Diagnosis not present

## 2017-06-05 DIAGNOSIS — R001 Bradycardia, unspecified: Secondary | ICD-10-CM | POA: Diagnosis not present

## 2017-06-05 DIAGNOSIS — R238 Other skin changes: Secondary | ICD-10-CM | POA: Diagnosis not present

## 2017-06-05 DIAGNOSIS — I700 Atherosclerosis of aorta: Secondary | ICD-10-CM | POA: Diagnosis not present

## 2017-06-05 DIAGNOSIS — I2510 Atherosclerotic heart disease of native coronary artery without angina pectoris: Secondary | ICD-10-CM | POA: Diagnosis not present

## 2017-06-05 DIAGNOSIS — I10 Essential (primary) hypertension: Secondary | ICD-10-CM | POA: Diagnosis not present

## 2017-06-21 DIAGNOSIS — L821 Other seborrheic keratosis: Secondary | ICD-10-CM | POA: Diagnosis not present

## 2017-06-21 DIAGNOSIS — L814 Other melanin hyperpigmentation: Secondary | ICD-10-CM | POA: Diagnosis not present

## 2017-06-21 DIAGNOSIS — D485 Neoplasm of uncertain behavior of skin: Secondary | ICD-10-CM | POA: Diagnosis not present

## 2017-06-23 IMAGING — CR DG HIP (WITH PELVIS) OPERATIVE*R*
3 series · 3 of 3 positions shown · non-contrast
Comparison: None.

CLINICAL DATA: ORIF right hip

EXAM:
OPERATIVE right HIP (WITH PELVIS IF PERFORMED) 1 VIEWS
TECHNIQUE: Fluoroscopic spot image(s) were submitted for interpretation
post-operatively.

[cont. (1 of 3)]
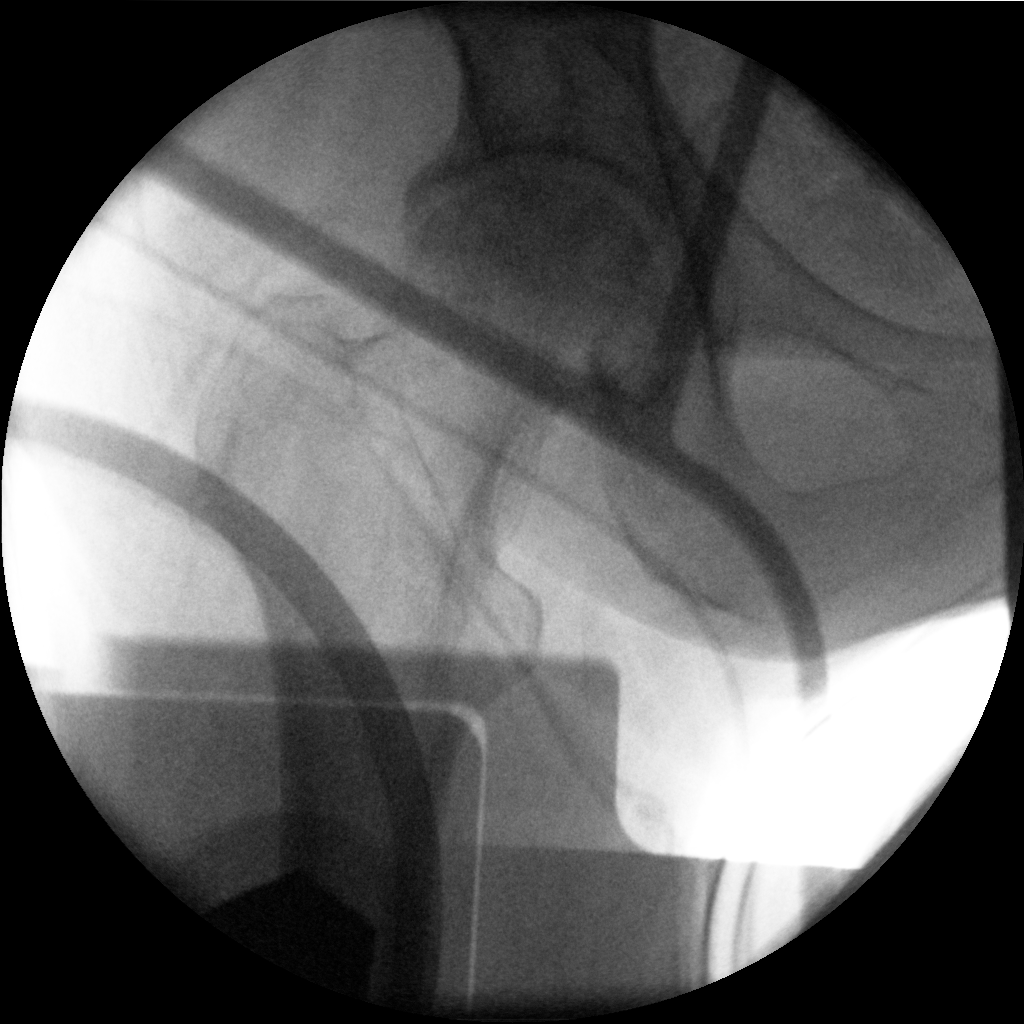

[cont. (2 of 3)]
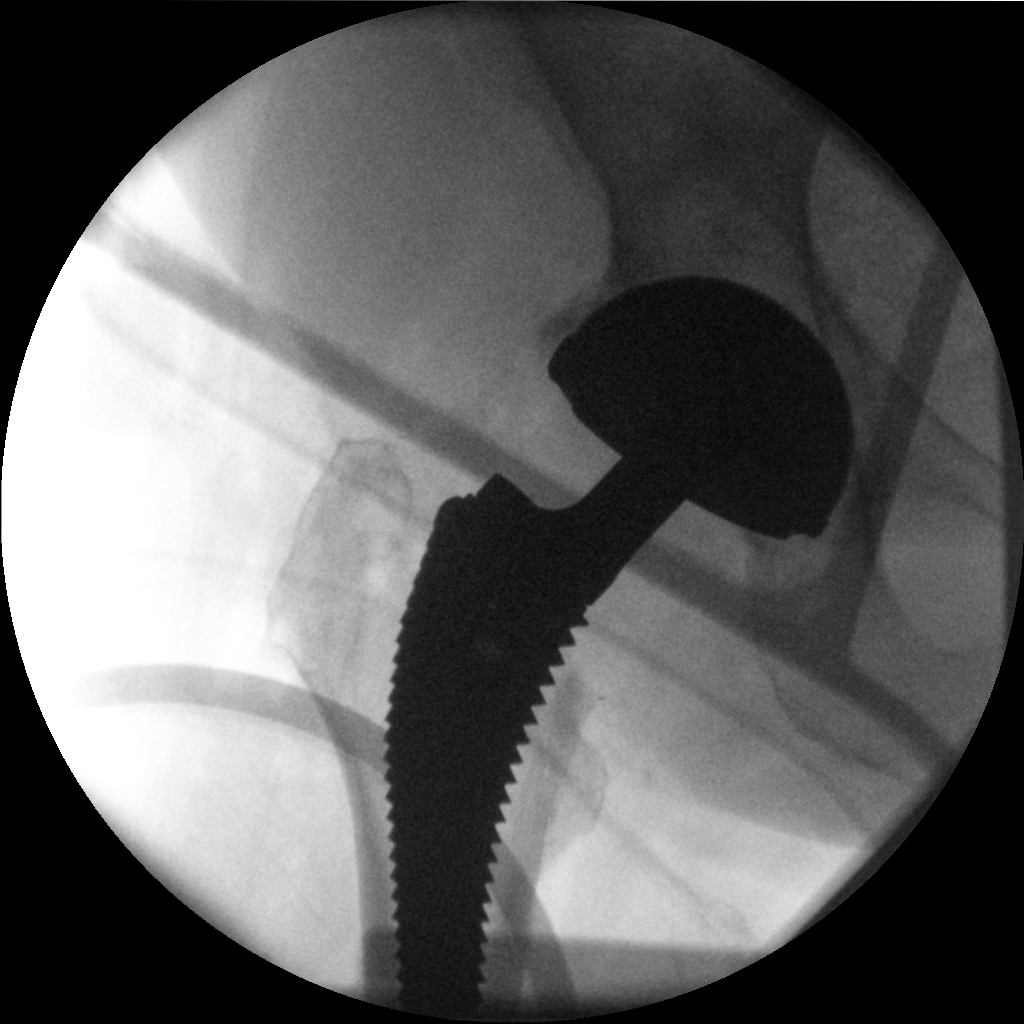

[cont. (3 of 3)]
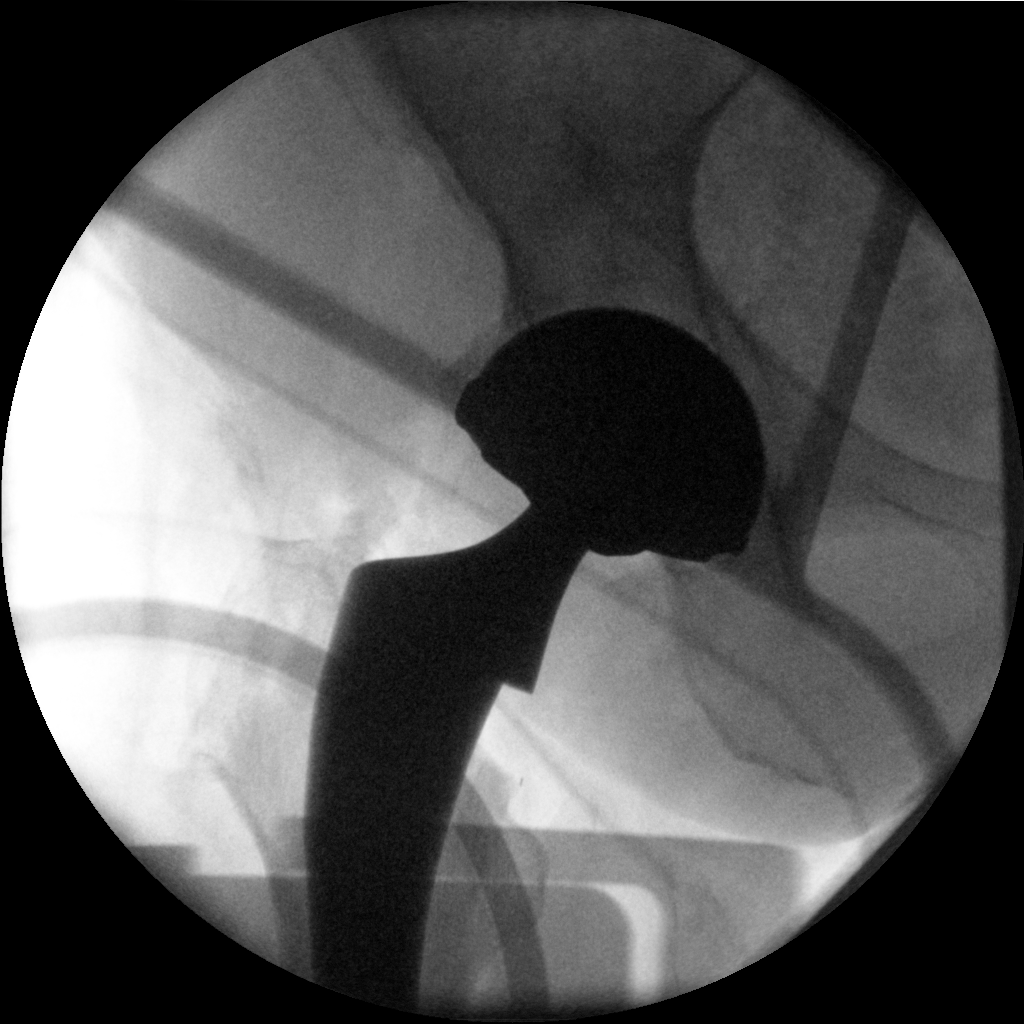

[3 of 3 positions shown; findings below may reference images not displayed]

FINDINGS: Three AP images of the right hip show placement of a total
arthroplasty. There is partial coverage of the arthroplasty with no
visualized periprosthetic fracture or dislocation.
IMPRESSION: Fluoroscopy for right hip arthroplasty.

## 2017-06-26 IMAGING — CR DG CHEST 1V PORT
1 series · 1 of 1 positions shown · non-contrast
Comparison: October 18, 2008

CLINICAL DATA: Decreased oxygen saturation levels

EXAM:
PORTABLE CHEST 1 VIEW

[ap]
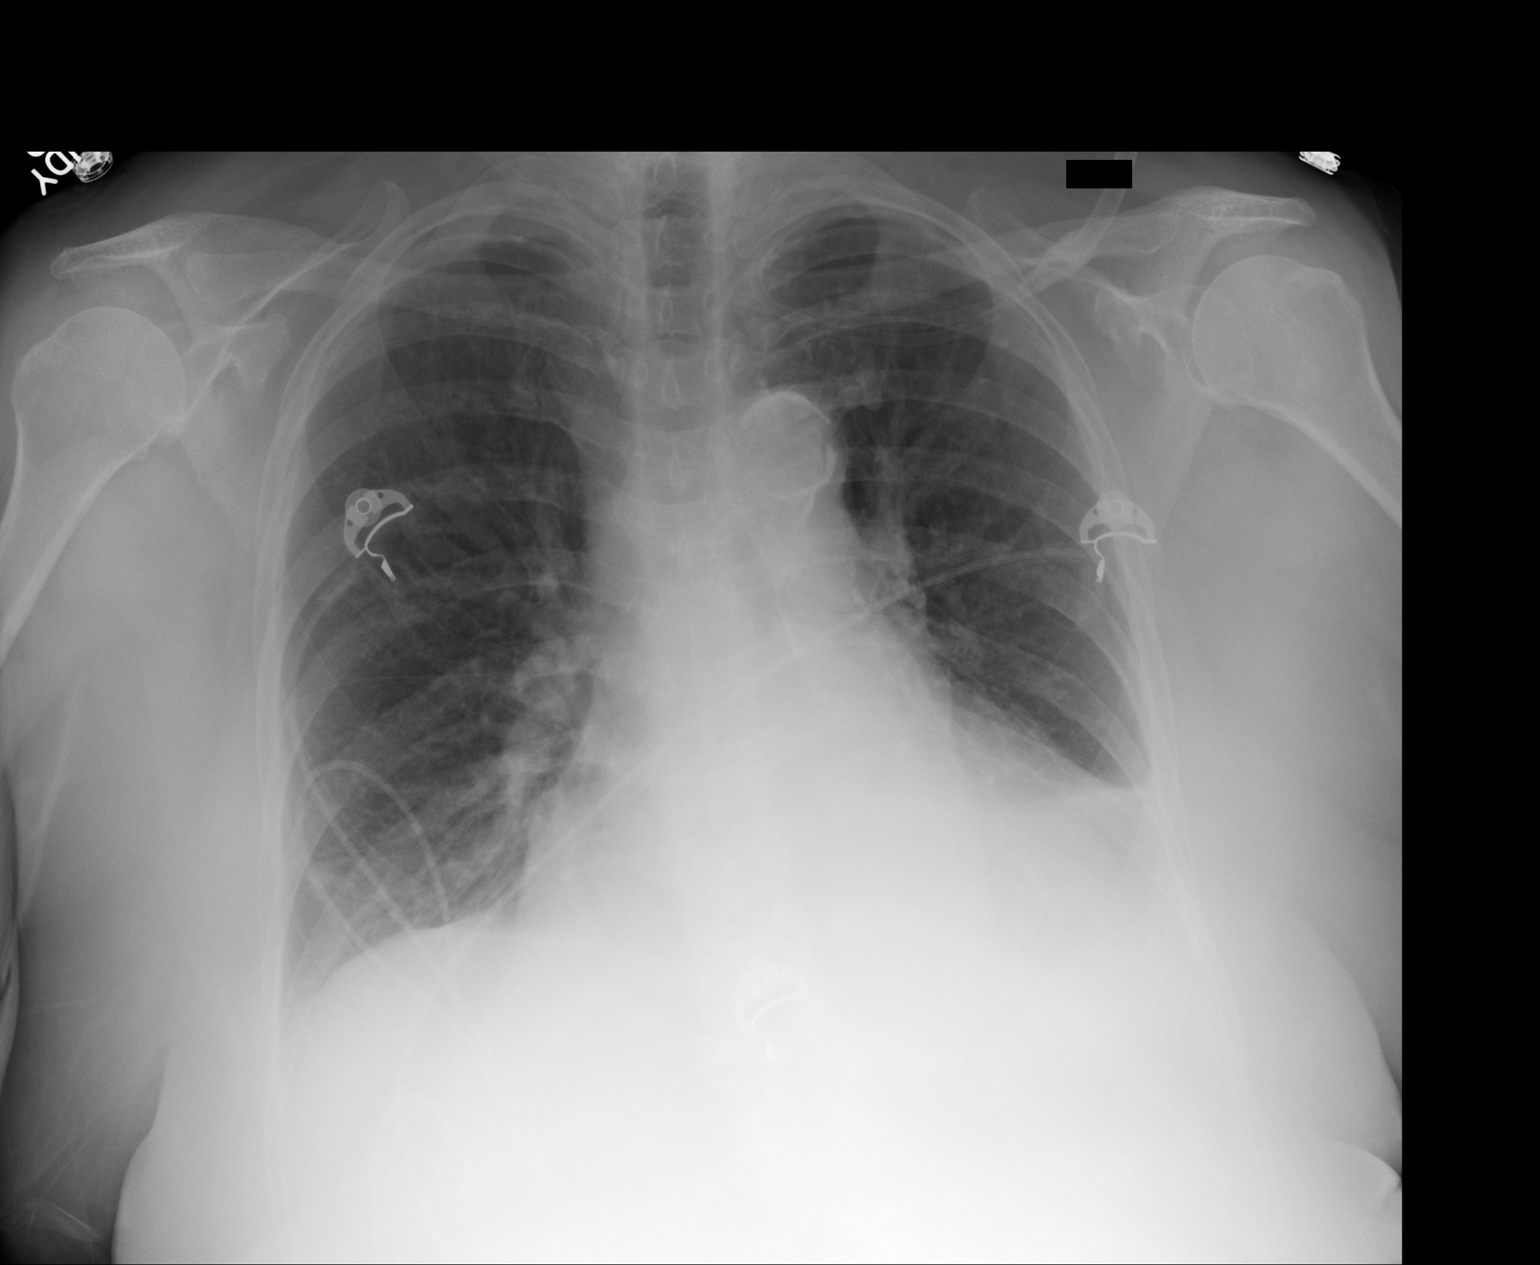

[1 of 1 positions shown; findings below may reference images not displayed]

FINDINGS: There is left base consolidation with small left effusion. Lungs
elsewhere clear. Heart is enlarged with pulmonary vascularity within
normal limits. No adenopathy. There is extensive atherosclerotic
calcification in aorta.
IMPRESSION: Medial opacity left base consistent with focal airspace
consolidation with small effusion. Lungs elsewhere clear. Stable
cardiomegaly.

## 2017-06-27 IMAGING — CR DG CHEST 1V
1 series · 1 of 1 positions shown · non-contrast
Comparison: 07/26/2015

CLINICAL DATA: Pneumonia

EXAM:
CHEST 1 VIEW

[portable]
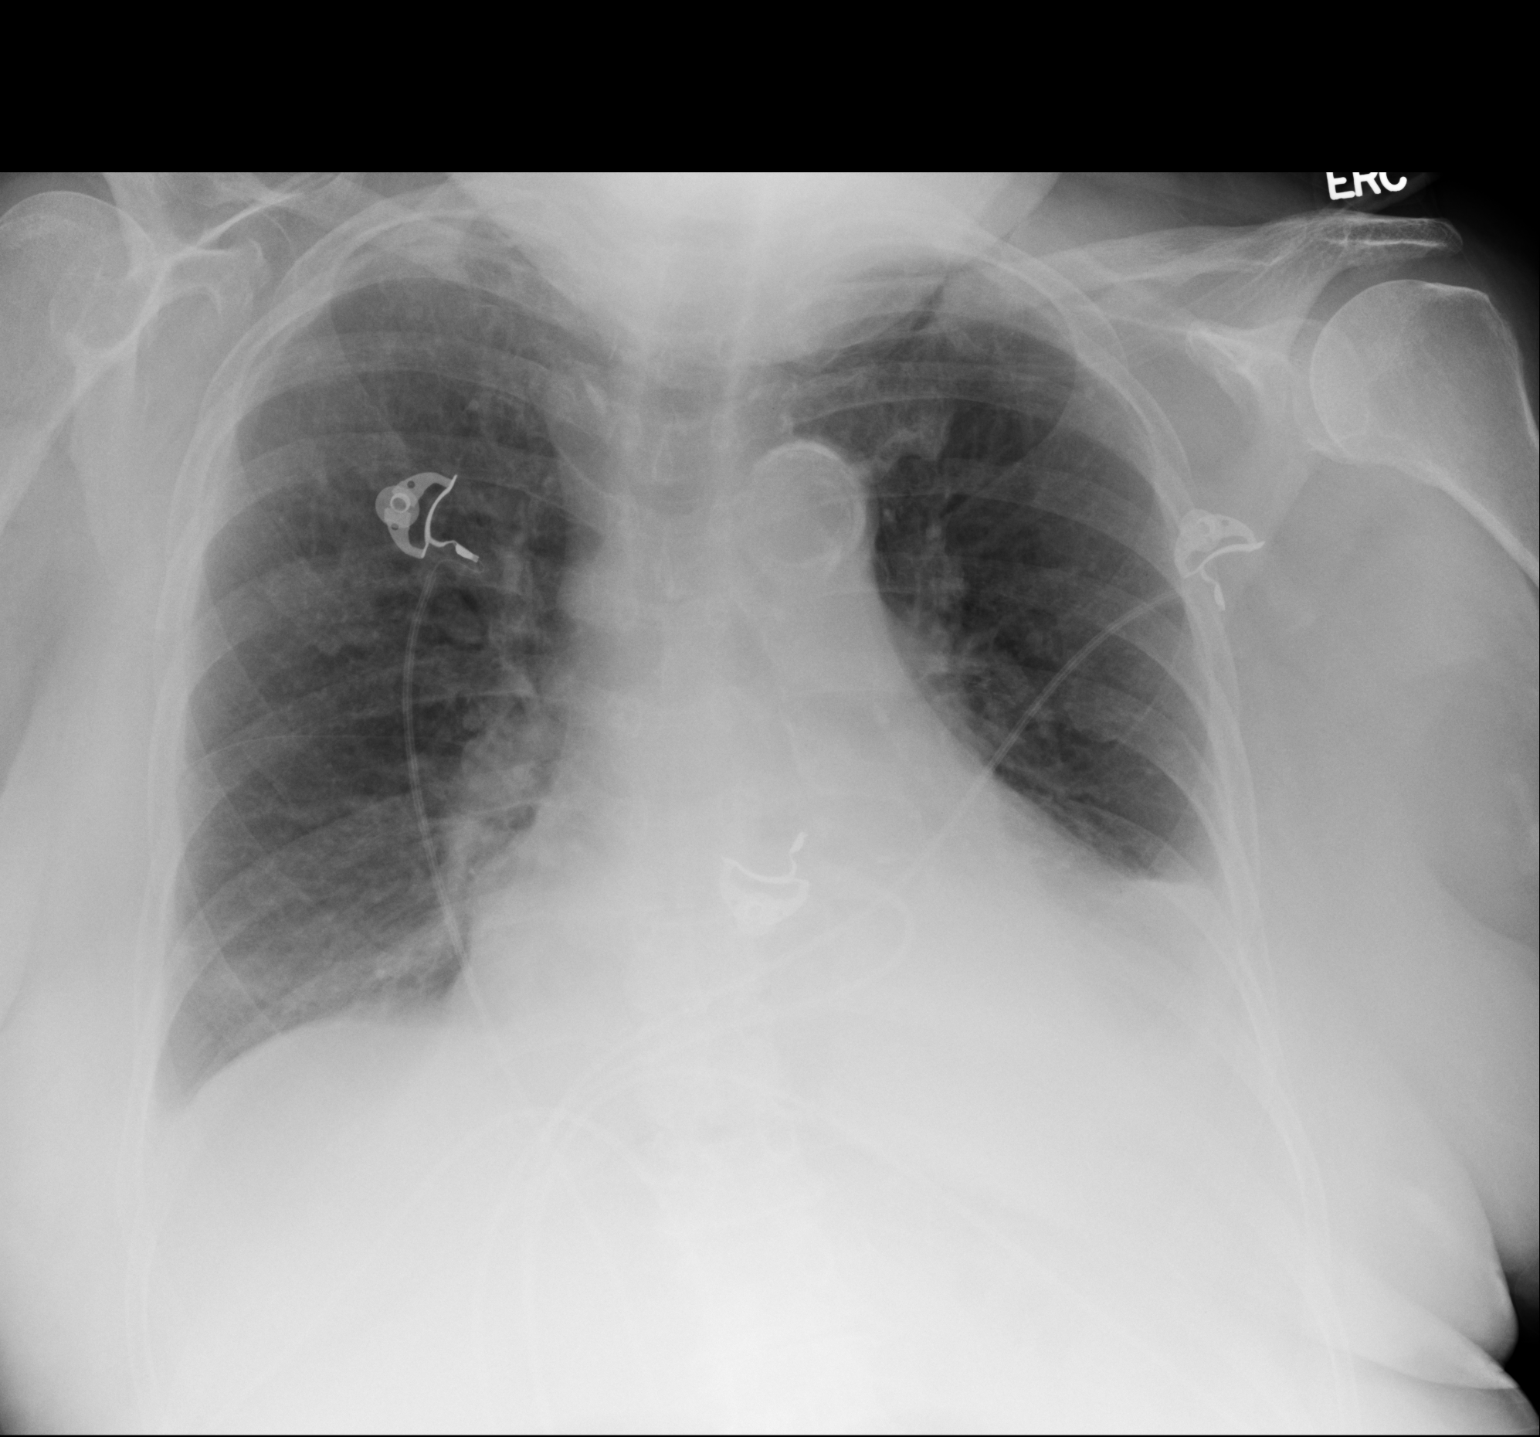

[1 of 1 positions shown; findings below may reference images not displayed]

FINDINGS: Cardiomegaly. Continued left basilar airspace opacity and small left
effusion, not significantly changed. No confluent opacity on the
right. No acute bony abnormality.
IMPRESSION: Stable left lower lobe infiltrate with small left effusion.

## 2017-07-17 DIAGNOSIS — J40 Bronchitis, not specified as acute or chronic: Secondary | ICD-10-CM | POA: Diagnosis not present

## 2017-07-17 DIAGNOSIS — J449 Chronic obstructive pulmonary disease, unspecified: Secondary | ICD-10-CM | POA: Diagnosis not present

## 2017-08-01 DIAGNOSIS — Z961 Presence of intraocular lens: Secondary | ICD-10-CM | POA: Diagnosis not present

## 2017-08-31 DIAGNOSIS — R739 Hyperglycemia, unspecified: Secondary | ICD-10-CM | POA: Diagnosis not present

## 2017-08-31 DIAGNOSIS — I2510 Atherosclerotic heart disease of native coronary artery without angina pectoris: Secondary | ICD-10-CM | POA: Diagnosis not present

## 2017-08-31 DIAGNOSIS — J449 Chronic obstructive pulmonary disease, unspecified: Secondary | ICD-10-CM | POA: Diagnosis not present

## 2017-09-07 DIAGNOSIS — I2510 Atherosclerotic heart disease of native coronary artery without angina pectoris: Secondary | ICD-10-CM | POA: Diagnosis not present

## 2017-09-07 DIAGNOSIS — N183 Chronic kidney disease, stage 3 (moderate): Secondary | ICD-10-CM | POA: Diagnosis not present

## 2017-09-07 DIAGNOSIS — I6521 Occlusion and stenosis of right carotid artery: Secondary | ICD-10-CM | POA: Diagnosis not present

## 2017-09-07 DIAGNOSIS — J449 Chronic obstructive pulmonary disease, unspecified: Secondary | ICD-10-CM | POA: Diagnosis not present

## 2017-09-07 DIAGNOSIS — I700 Atherosclerosis of aorta: Secondary | ICD-10-CM | POA: Diagnosis not present

## 2017-09-07 DIAGNOSIS — F325 Major depressive disorder, single episode, in full remission: Secondary | ICD-10-CM | POA: Diagnosis not present

## 2017-09-07 DIAGNOSIS — R739 Hyperglycemia, unspecified: Secondary | ICD-10-CM | POA: Diagnosis not present

## 2017-09-07 DIAGNOSIS — Z0000 Encounter for general adult medical examination without abnormal findings: Secondary | ICD-10-CM | POA: Diagnosis not present

## 2017-09-26 DIAGNOSIS — J449 Chronic obstructive pulmonary disease, unspecified: Secondary | ICD-10-CM | POA: Diagnosis not present

## 2017-09-26 DIAGNOSIS — J441 Chronic obstructive pulmonary disease with (acute) exacerbation: Secondary | ICD-10-CM | POA: Diagnosis not present

## 2017-10-03 ENCOUNTER — Telehealth: Payer: Self-pay | Admitting: *Deleted

## 2017-10-03 NOTE — Telephone Encounter (Signed)
error 

## 2017-10-09 DIAGNOSIS — M48062 Spinal stenosis, lumbar region with neurogenic claudication: Secondary | ICD-10-CM | POA: Diagnosis not present

## 2017-10-09 DIAGNOSIS — M5416 Radiculopathy, lumbar region: Secondary | ICD-10-CM | POA: Diagnosis not present

## 2017-10-09 DIAGNOSIS — M5136 Other intervertebral disc degeneration, lumbar region: Secondary | ICD-10-CM | POA: Diagnosis not present

## 2017-11-21 DIAGNOSIS — J441 Chronic obstructive pulmonary disease with (acute) exacerbation: Secondary | ICD-10-CM | POA: Diagnosis not present

## 2017-11-21 DIAGNOSIS — J40 Bronchitis, not specified as acute or chronic: Secondary | ICD-10-CM | POA: Diagnosis not present

## 2017-12-05 IMAGING — US US EXTREM LOW VENOUS BILAT
1 series · 13 of 24 positions shown · non-contrast
Comparison: None.

CLINICAL DATA: Bilateral lower extremity pain and edema for 3
weeks.



[Series 1: us extrem low venous bilat · 0.08mm/px · 13 of 55 slices shown]
[im 1/55]
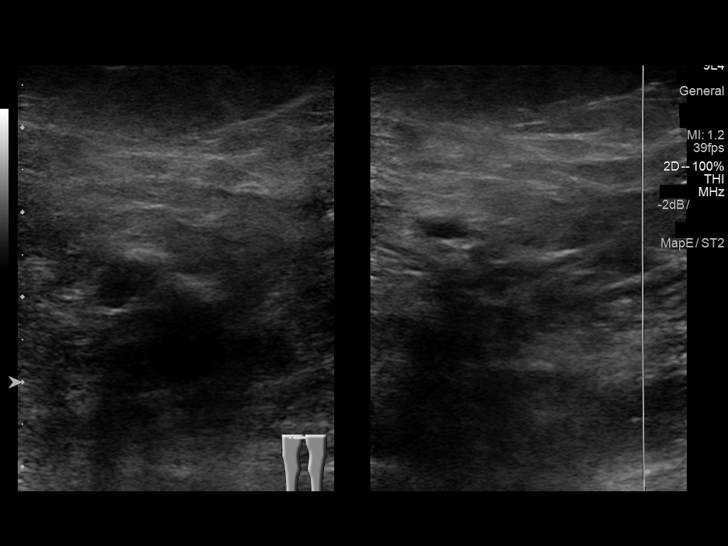
[im 5/55]
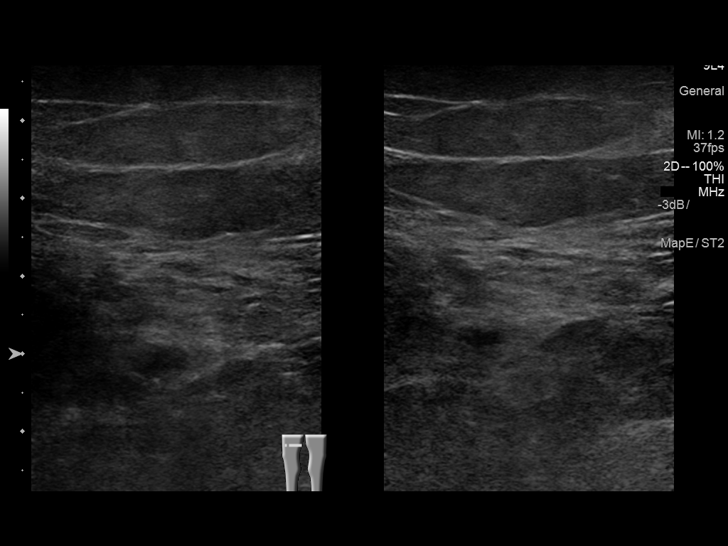
[im 10/55]
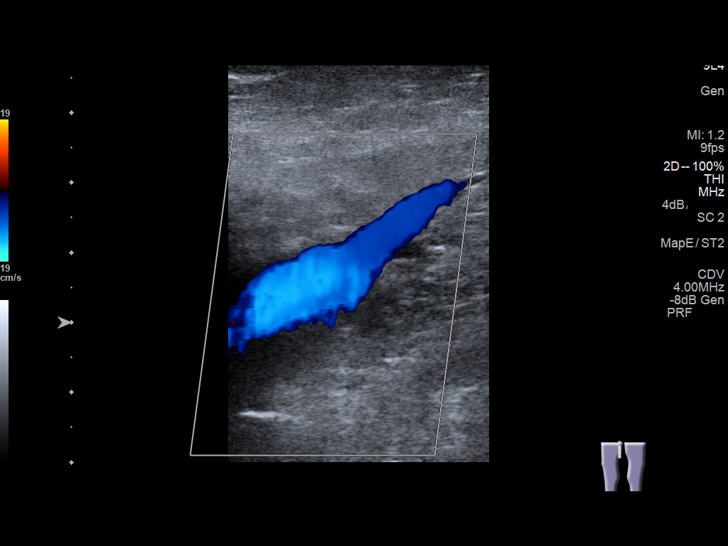
[im 15/55]
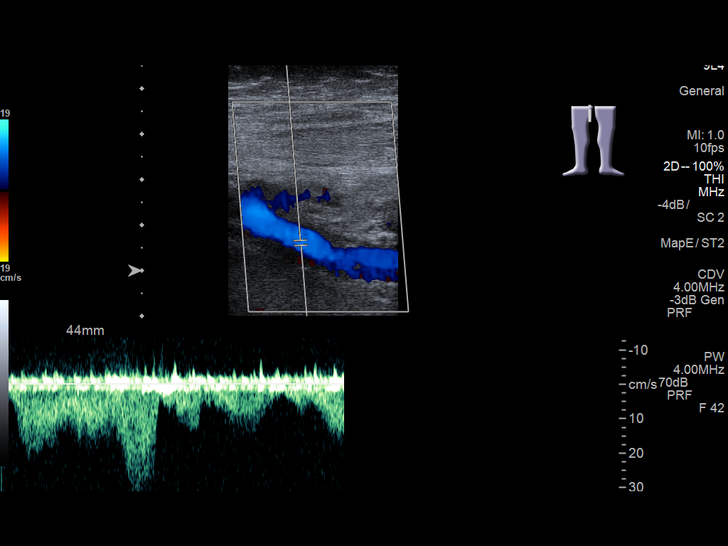
[im 19/55]
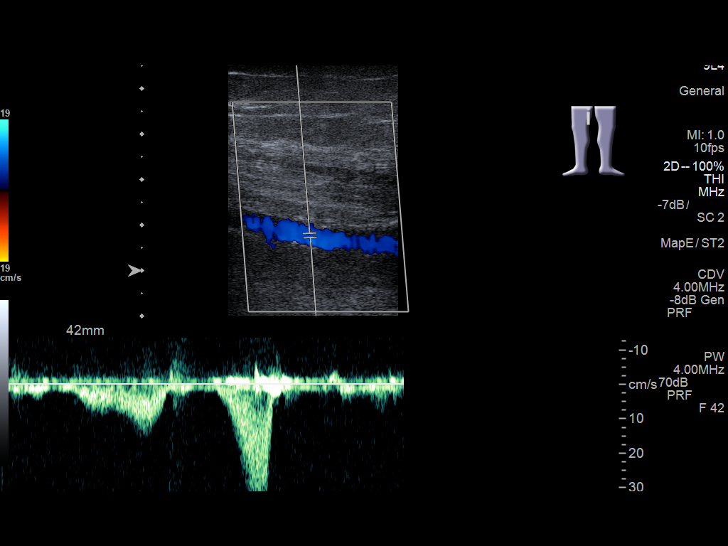
[im 24/55]
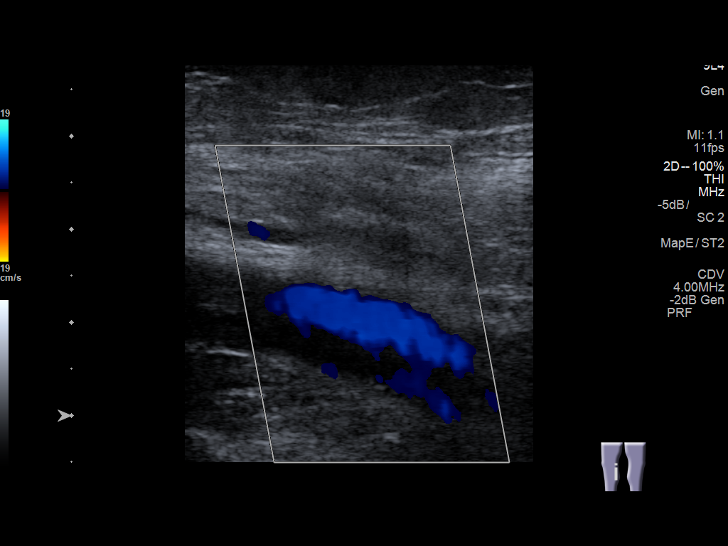
[im 29/55]
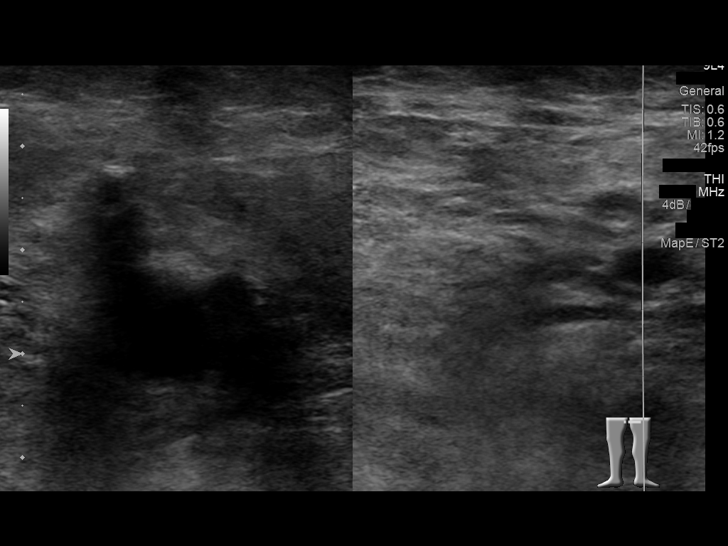
[im 31/55]
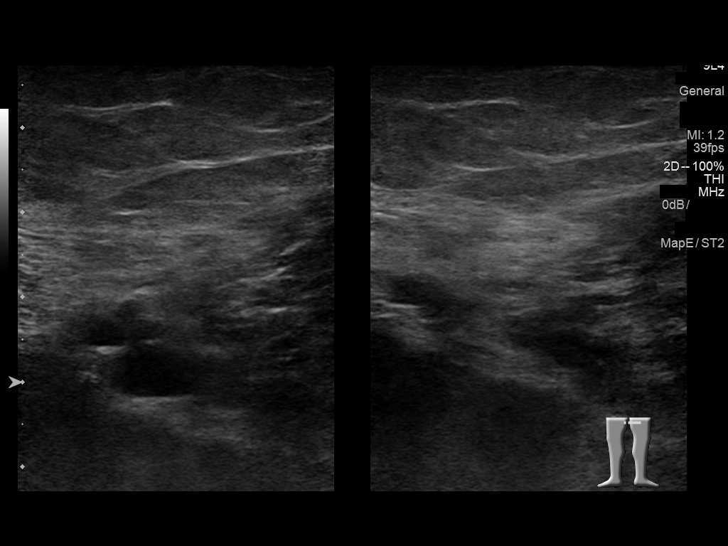
[im 36/55]
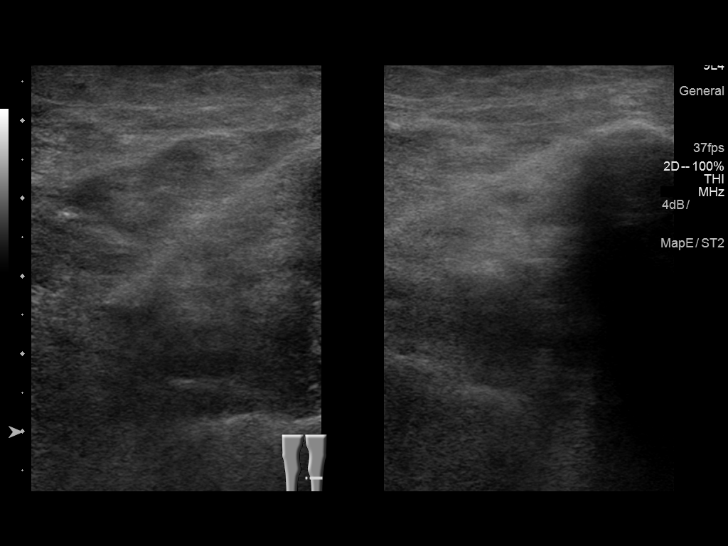
[im 40/55]
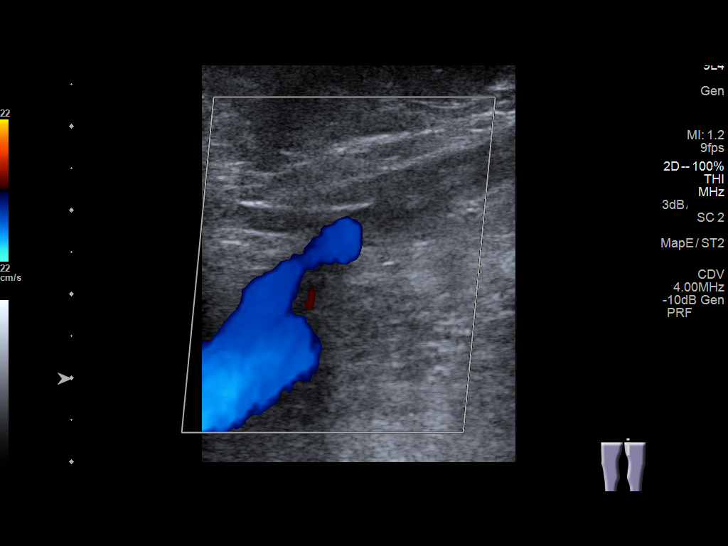
[im 45/55]
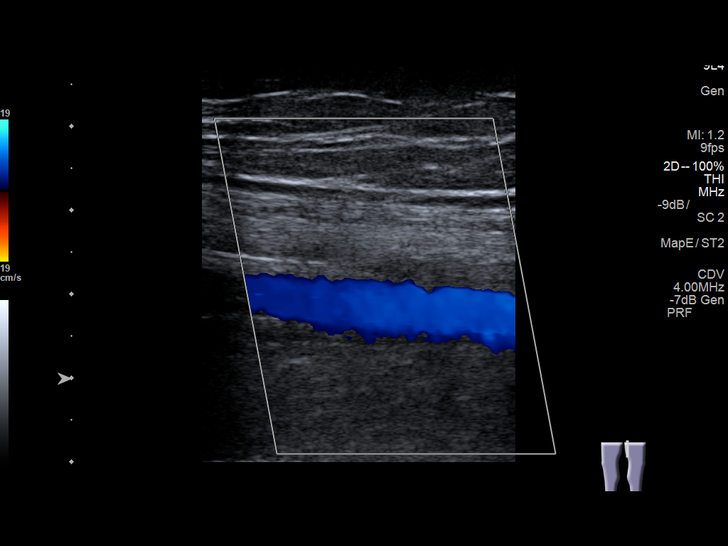
[im 50/55]
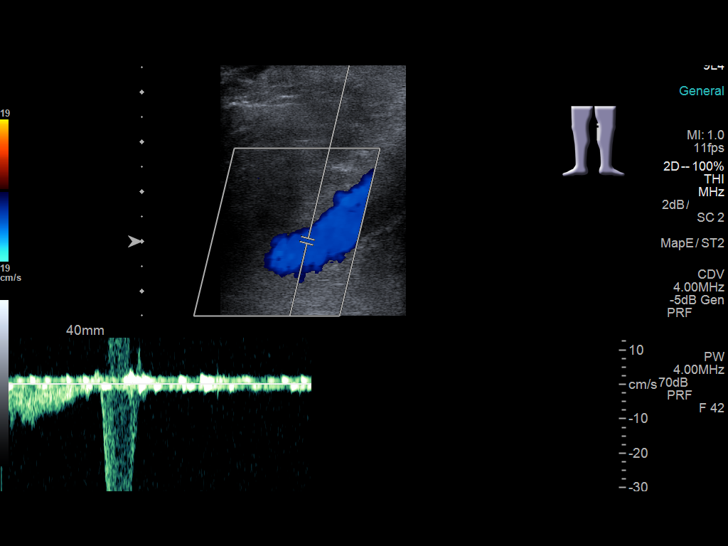
[im 55/55]
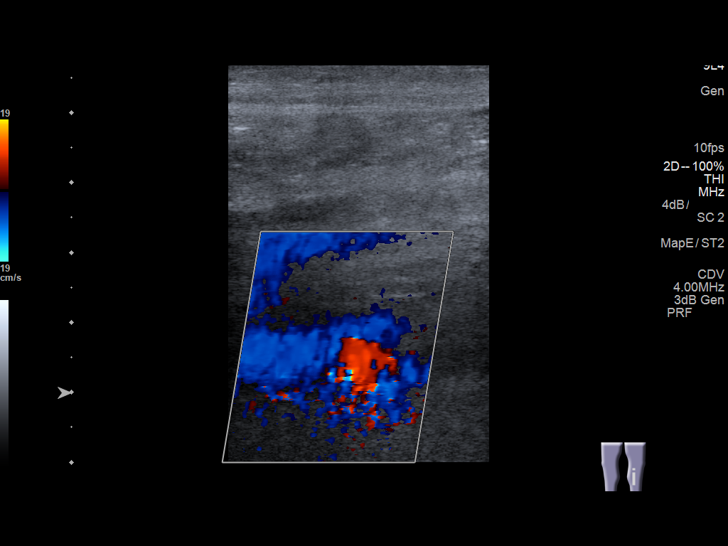

[13 of 24 positions shown; findings below may reference images not displayed]

FINDINGS: RIGHT LOWER EXTREMITY

Common Femoral Vein: No evidence of thrombus. Normal
compressibility, respiratory phasicity and response to augmentation.

Saphenofemoral Junction: No evidence of thrombus. Normal
compressibility and flow on color Doppler imaging.

Profunda Femoral Vein: No evidence of thrombus. Normal
compressibility and flow on color Doppler imaging.

Femoral Vein: No evidence of thrombus. Normal compressibility,
respiratory phasicity and response to augmentation.

Popliteal Vein: No evidence of thrombus. Normal compressibility,
respiratory phasicity and response to augmentation.

Calf Veins: No evidence of thrombus. Normal compressibility and flow
on color Doppler imaging.

Superficial Great Saphenous Vein: No evidence of thrombus. Normal
compressibility and flow on color Doppler imaging.

Venous Reflux:  None.

Other Findings:  None.

LEFT LOWER EXTREMITY

Common Femoral Vein: No evidence of thrombus. Normal
compressibility, respiratory phasicity and response to augmentation.

Saphenofemoral Junction: No evidence of thrombus. Normal
compressibility and flow on color Doppler imaging.

Profunda Femoral Vein: No evidence of thrombus. Normal
compressibility and flow on color Doppler imaging.

Femoral Vein: No evidence of thrombus. Normal compressibility,
respiratory phasicity and response to augmentation.

Popliteal Vein: No evidence of thrombus. Normal compressibility,
respiratory phasicity and response to augmentation.

Calf Veins: No evidence of thrombus. Normal compressibility and flow
on color Doppler imaging.

Superficial Great Saphenous Vein: No evidence of thrombus. Normal
compressibility and flow on color Doppler imaging.

Venous Reflux:  None.

Other Findings:  None.
IMPRESSION: No evidence of deep venous thrombosis seen in either lower
extremity.

## 2018-01-04 DIAGNOSIS — M5416 Radiculopathy, lumbar region: Secondary | ICD-10-CM | POA: Diagnosis not present

## 2018-01-04 DIAGNOSIS — M48062 Spinal stenosis, lumbar region with neurogenic claudication: Secondary | ICD-10-CM | POA: Diagnosis not present

## 2018-01-04 DIAGNOSIS — M5136 Other intervertebral disc degeneration, lumbar region: Secondary | ICD-10-CM | POA: Diagnosis not present

## 2018-01-16 DIAGNOSIS — J019 Acute sinusitis, unspecified: Secondary | ICD-10-CM | POA: Diagnosis not present

## 2018-01-16 DIAGNOSIS — J441 Chronic obstructive pulmonary disease with (acute) exacerbation: Secondary | ICD-10-CM | POA: Diagnosis not present

## 2023-12-14 DEATH — deceased
